# Patient Record
Sex: Female | Born: 1990 | Race: Black or African American | Hispanic: No | Smoking: Former smoker
Health system: Southern US, Community
[De-identification: ages and names within clinical notes are randomized; demographics above are authoritative.]

## PROBLEM LIST (undated history)

## (undated) DIAGNOSIS — B9689 Other specified bacterial agents as the cause of diseases classified elsewhere: Secondary | ICD-10-CM

## (undated) DIAGNOSIS — N760 Acute vaginitis: Secondary | ICD-10-CM

---
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
## 2011-09-05 ENCOUNTER — Other Ambulatory Visit: Payer: Self-pay | Admitting: Family Medicine

## 2011-09-05 DIAGNOSIS — Z3689 Encounter for other specified antenatal screening: Secondary | ICD-10-CM

## 2011-09-10 ENCOUNTER — Ambulatory Visit (HOSPITAL_COMMUNITY)
Admission: RE | Admit: 2011-09-10 | Discharge: 2011-09-10 | Disposition: A | Discharge status: Home / Self Care | Payer: Medicaid Other | Source: Ambulatory Visit | Attending: Family Medicine | Admitting: Family Medicine

## 2011-09-10 DIAGNOSIS — Z1389 Encounter for screening for other disorder: Secondary | ICD-10-CM | POA: Insufficient documentation

## 2011-09-10 DIAGNOSIS — O358XX0 Maternal care for other (suspected) fetal abnormality and damage, not applicable or unspecified: Secondary | ICD-10-CM | POA: Insufficient documentation

## 2011-09-10 DIAGNOSIS — O0930 Supervision of pregnancy with insufficient antenatal care, unspecified trimester: Secondary | ICD-10-CM | POA: Insufficient documentation

## 2011-09-10 DIAGNOSIS — Z363 Encounter for antenatal screening for malformations: Secondary | ICD-10-CM | POA: Insufficient documentation

## 2011-09-10 DIAGNOSIS — Z3689 Encounter for other specified antenatal screening: Secondary | ICD-10-CM

## 2011-10-03 ENCOUNTER — Other Ambulatory Visit: Payer: Self-pay | Admitting: Family Medicine

## 2011-10-03 DIAGNOSIS — O321XX0 Maternal care for breech presentation, not applicable or unspecified: Secondary | ICD-10-CM

## 2011-10-05 ENCOUNTER — Ambulatory Visit (HOSPITAL_COMMUNITY)
Admission: RE | Admit: 2011-10-05 | Discharge: 2011-10-05 | Disposition: A | Discharge status: Home / Self Care | Payer: Medicaid Other | Source: Ambulatory Visit | Attending: Family Medicine | Admitting: Family Medicine

## 2011-10-05 DIAGNOSIS — Z3689 Encounter for other specified antenatal screening: Secondary | ICD-10-CM | POA: Insufficient documentation

## 2011-10-05 DIAGNOSIS — O321XX0 Maternal care for breech presentation, not applicable or unspecified: Secondary | ICD-10-CM | POA: Insufficient documentation

## 2013-10-12 ENCOUNTER — Emergency Department (HOSPITAL_COMMUNITY)
Admission: EM | Admit: 2013-10-12 | Discharge: 2013-10-12 | Disposition: A | Discharge status: Home / Self Care | Payer: Medicaid Other | Attending: Emergency Medicine | Admitting: Emergency Medicine

## 2013-10-12 ENCOUNTER — Encounter (HOSPITAL_COMMUNITY): Payer: Self-pay | Admitting: Emergency Medicine

## 2013-10-12 DIAGNOSIS — Z79899 Other long term (current) drug therapy: Secondary | ICD-10-CM | POA: Insufficient documentation

## 2013-10-12 DIAGNOSIS — L03113 Cellulitis of right upper limb: Secondary | ICD-10-CM

## 2013-10-12 DIAGNOSIS — IMO0002 Reserved for concepts with insufficient information to code with codable children: Secondary | ICD-10-CM | POA: Insufficient documentation

## 2013-10-12 DIAGNOSIS — Z87891 Personal history of nicotine dependence: Secondary | ICD-10-CM | POA: Insufficient documentation

## 2013-10-12 MED ORDER — CEPHALEXIN 500 MG PO CAPS: 500.0000 mg | ORAL_CAPSULE | Freq: Four times a day (QID) | ORAL | Status: DC

## 2013-10-12 NOTE — ED Provider Notes (Signed)
CSN: [id number]     Arrival date & time 10/12/13  1836 History  This chart was scribed for non-physician practitioner Jillyn Ledger, PA-C working with Roney Marion, MD by Leone Payor, ED Scribe. This patient was seen in room TR11C/TR11C and the patient's care was started at 1836.    Chief Complaint  Patient presents with  . Abscess   The history is provided by the patient. No language interpreter was used.    HPI Comments: Donna Perkins is a 22 y.o. female who presents to the Emergency Department complaining of 2 days of gradual onset, gradually worsening, constant redness to the right forearm. Pt states she has associated itching and swelling. Pt states she believes she was bitten by a spider/other insect, but did not see anything bite her. She denies trying anything for these symptoms at home. She denies drainage, fever, severe pain, weakness, loss of sensation, myalgias, nausea, emesis, abdominal pain, arthralgias, or other concerns.  She denies any hx of abscess.     History reviewed. No pertinent past medical history. Past Surgical History  Procedure Laterality Date  . Cesarean section     No family history on file. History  Substance Use Topics  . Smoking status: Former Games developer  . Smokeless tobacco: Not on file  . Alcohol Use: Yes     Comment: occ   OB History   Grav Para Term Preterm Abortions TAB SAB Ect Mult Living                 Review of Systems  Constitutional: Negative for fever, chills, activity change, appetite change and fatigue.  HENT: Negative for rhinorrhea and sore throat.   Respiratory: Negative for cough and shortness of breath.   Cardiovascular: Negative for chest pain and leg swelling.  Gastrointestinal: Negative for nausea, vomiting and abdominal pain.  Genitourinary: Negative for dysuria.  Musculoskeletal: Negative for arthralgias, back pain, gait problem, joint swelling, myalgias, neck pain and neck stiffness.  Skin: Positive for color change.  Negative for rash and wound.       Redness and swelling  Neurological: Negative for weakness, numbness and headaches.  All other systems reviewed and are negative.    Allergies  Review of patient's allergies indicates no known allergies.  Home Medications   Current Outpatient Rx  Name  Route  Sig  Dispense  Refill  . norethindrone (MICRONOR,CAMILA,ERRIN) 0.35 MG tablet   Oral   Take 1 tablet by mouth daily.          BP 114/71  Pulse 76  Temp(Src) 98.7 F (37.1 C) (Oral)  Resp 18  Wt 161 lb 8 oz (73.256 kg)  SpO2 97%  Filed Vitals:   10/12/13 1842 10/12/13 2104  BP: 114/71 116/88  Pulse: 76 71  Temp: 98.7 F (37.1 C)   TempSrc: Oral   Resp: 18 14  Weight: 161 lb 8 oz (73.256 kg)   SpO2: 97% 100%    Physical Exam  Nursing note and vitals reviewed. Constitutional: She is oriented to person, place, and time. She appears well-developed and well-nourished. No distress.  HENT:  Head: Normocephalic and atraumatic.  Nose: Nose normal.  Eyes: Conjunctivae and EOM are normal. Right eye exhibits no discharge. Left eye exhibits no discharge.  Neck: Neck supple.  Cardiovascular: Normal rate, regular rhythm and normal heart sounds.  Exam reveals no gallop and no friction rub.   No murmur heard. Radial pulses present bilaterally  Pulmonary/Chest: Effort normal and breath sounds normal. No  stridor. No respiratory distress. She has no wheezes. She has no rales. She exhibits no tenderness.  Abdominal: Soft. She exhibits no distension. There is no tenderness.  Musculoskeletal: Normal range of motion. She exhibits edema. She exhibits no tenderness.  No tenderness to the right elbow or hand throughout.  Grip strength 5/5 bilaterally.  No limitations with elbow ROM on the right.    Neurological: She is alert and oriented to person, place, and time.  Sensation intact in the right UE  Skin: Skin is warm and dry. She is not diaphoretic. There is erythema.  5 cm x 6 cm circular area  of well circumscribed erythema and mild edema to the right medial middle forearm with no induration or fluctuance.  A pinpoint closed lesion is present in the center with no active drainage or bleeding noted. No ecchymosis.  No other lesions or rash throughout.     Psychiatric: She has a normal mood and affect.    ED Course  Procedures   DIAGNOSTIC STUDIES: Oxygen Saturation is 97% on RA, normal by my interpretation.    COORDINATION OF CARE: 8:56 PM Discussed treatment plan with pt at bedside and pt agreed to plan.   Labs Review Labs Reviewed - No data to display Imaging Review No results found.  EKG Interpretation   None       MDM   1. Cellulitis of right forearm     Donna Perkins is a 21 y.o. female who presents to the Emergency Department complaining of 2 days of gradual onset, gradually worsening, constant redness to the right forearm.  Area marked with a surgical pen.     Patient likely has a developing cellulitis vs abscess from a possible insect bite.  I&D not indicated at this time.  Patient neurovascularly intact, afebrile, and non-toxic in appearance.  Patient prescribed Keflex.  She was instructed to follow-up with a PCP for wound re-check.  She was instructed to return to the ED if she has spreading redness/swelling, fever, drainage, severe pain, or other concerns.  She was instructed to take Benadryl for itching.  Patient in agreement with discharge and plan.     Final impressions: 1. Cellulitis of right forearm     Donna Perkins   I personally performed the services described in this documentation, which was scribed in my presence. The recorded information has been reviewed and is accurate.       Jillyn Ledger, PA-C 10/14/13 1053

## 2013-10-12 NOTE — ED Notes (Signed)
Area of redness marked on pt's skin.

## 2013-10-12 NOTE — ED Notes (Signed)
Pt is here with right forearm red, swollen area that appears to have abscess area in center.  Not draining

## 2013-10-17 NOTE — ED Provider Notes (Signed)
Medical screening examination/treatment/procedure(s) were performed by non-physician practitioner and as supervising physician I was immediately available for consultation/collaboration.  EKG Interpretation   None         Donna Perkins J Aamna Mallozzi, MD 10/17/13 1107 

## 2014-08-13 ENCOUNTER — Emergency Department (HOSPITAL_COMMUNITY)
Admission: EM | Admit: 2014-08-13 | Discharge: 2014-08-13 | Disposition: A | Discharge status: Home / Self Care | Payer: No Typology Code available for payment source | Attending: Emergency Medicine | Admitting: Emergency Medicine

## 2014-08-13 ENCOUNTER — Encounter (HOSPITAL_COMMUNITY): Payer: Self-pay | Admitting: Emergency Medicine

## 2014-08-13 DIAGNOSIS — Z79899 Other long term (current) drug therapy: Secondary | ICD-10-CM | POA: Diagnosis not present

## 2014-08-13 DIAGNOSIS — Z87891 Personal history of nicotine dependence: Secondary | ICD-10-CM | POA: Diagnosis not present

## 2014-08-13 DIAGNOSIS — S0990XA Unspecified injury of head, initial encounter: Secondary | ICD-10-CM | POA: Diagnosis present

## 2014-08-13 DIAGNOSIS — Z792 Long term (current) use of antibiotics: Secondary | ICD-10-CM | POA: Insufficient documentation

## 2014-08-13 DIAGNOSIS — Y9389 Activity, other specified: Secondary | ICD-10-CM | POA: Diagnosis not present

## 2014-08-13 DIAGNOSIS — Y92410 Unspecified street and highway as the place of occurrence of the external cause: Secondary | ICD-10-CM | POA: Insufficient documentation

## 2014-08-13 MED ORDER — ACETAMINOPHEN 325 MG PO TABS
650.0000 mg | ORAL_TABLET | Freq: Once | ORAL | Status: AC
  Administered 2014-08-13: 650 mg via ORAL
  Filled 2014-08-13: qty 2

## 2014-08-13 MED ORDER — ACETAMINOPHEN 325 MG PO TABS: 650.0000 mg | ORAL_TABLET | Freq: Four times a day (QID) | ORAL | Status: DC | PRN
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
## 2014-08-13 NOTE — ED Notes (Signed)
Pt verbalizes understanding of d/c instructions and denies any further needs at this time. 

## 2014-08-13 NOTE — Discharge Instructions (Signed)
Head Injury You have a head injury. Headaches and throwing up (vomiting) are common after a head injury. It should be easy to wake up from sleeping. Sometimes you must stay in the hospital. Most problems happen within the first 24 hours. Side effects may occur up to 7-10 days after the injury.  WHAT ARE THE TYPES OF HEAD INJURIES? Head injuries can be as minor as a bump. Some head injuries can be more severe. More severe head injuries include:  A jarring injury to the brain (concussion).  A bruise of the brain (contusion). This mean there is bleeding in the brain that can cause swelling.  A cracked skull (skull fracture).  Bleeding in the brain that collects, clots, and forms a bump (hematoma). WHEN SHOULD I GET HELP RIGHT AWAY?   You are confused or sleepy.  You cannot be woken up.  You feel sick to your stomach (nauseous) or keep throwing up (vomiting).  Your dizziness or unsteadiness is getting worse.  You have very bad, lasting headaches that are not helped by medicine. Take medicines only as told by your doctor.  You cannot use your arms or legs like normal.  You cannot walk.  You notice changes in the black spots in the center of the colored part of your eye (pupil).  You have clear or bloody fluid coming from your nose or ears.  You have trouble seeing. During the next 24 hours after the injury, you must stay with someone who can watch you. This person should get help right away (call 911 in the [place] if you start to shake and are not able to control it (have seizures), you pass out, or you are unable to wake up. HOW CAN I PREVENT A HEAD INJURY IN THE FUTURE?  Wear seat belts.  Wear a helmet while bike riding and playing sports like football.  Stay away from dangerous activities around the house. WHEN CAN I RETURN TO NORMAL ACTIVITIES AND ATHLETICS? See your doctor before doing these activities. You should not do normal activities or play contact sports until 1 week  after the following symptoms have stopped:  Headache that does not go away.  Dizziness.  Poor attention.  Confusion.  Memory problems.  Sickness to your stomach or throwing up.  Tiredness.  Fussiness.  Bothered by bright lights or loud noises.  Anxiousness or depression.  Restless sleep. MAKE SURE YOU:   Understand these instructions.  Will watch your condition.  Will get help right away if you are not doing well or get worse. Document Released: 11/15/2008 Document Revised: 04/19/2014 Document Reviewed: 08/10/2013 [facility] Patient Information 2015 [place], [place]. This information is not intended to replace advice given to you by your health care provider. Make sure you discuss any questions you have with your health care provider.  Motor Vehicle Collision It is common to have multiple bruises and sore muscles after a motor vehicle collision (MVC). These tend to feel worse for the first 24 hours. You may have the most stiffness and soreness over the first several hours. You may also feel worse when you wake up the first morning after your collision. After this point, you will usually begin to improve with each day. The speed of improvement often depends on the severity of the collision, the number of injuries, and the location and nature of these injuries. HOME CARE INSTRUCTIONS  Put ice on the injured area.  Put ice in a plastic bag.  Place a towel between your skin and  the bag.  Leave the ice on for 15-20 minutes, 3-4 times a day, or as directed by your health care provider.  Drink enough fluids to keep your urine clear or pale yellow. Do not drink alcohol.  Take a warm shower or bath once or twice a day. This will increase blood flow to sore muscles.  You may return to activities as directed by your caregiver. Be careful when lifting, as this may aggravate neck or back pain.  Only take over-the-counter or prescription medicines for pain, discomfort, or fever as  directed by your caregiver. Do not use aspirin. This may increase bruising and bleeding. SEEK IMMEDIATE MEDICAL CARE IF:  You have numbness, tingling, or weakness in the arms or legs.  You develop severe headaches not relieved with medicine.  You have severe neck pain, especially tenderness in the middle of the back of your neck.  You have changes in bowel or bladder control.  There is increasing pain in any area of the body.  You have shortness of breath, light-headedness, dizziness, or fainting.  You have chest pain.  You feel sick to your stomach (nauseous), throw up (vomit), or sweat.  You have increasing abdominal discomfort.  There is blood in your urine, stool, or vomit.  You have pain in your shoulder (shoulder strap areas).  You feel your symptoms are getting worse. MAKE SURE YOU:  Understand these instructions.  Will watch your condition.  Will get help right away if you are not doing well or get worse. Document Released: 12/03/2005 Document Revised: 04/19/2014 Document Reviewed: 05/02/2011 [facility] Patient Information 2015 [place]. This information is not intended to replace advice given to you by your health care provider. Make sure you discuss any questions you have with your health care provider.

## 2014-08-27 ENCOUNTER — Other Ambulatory Visit (HOSPITAL_COMMUNITY): Payer: Self-pay | Admitting: Chiropractic Medicine

## 2014-08-27 ENCOUNTER — Ambulatory Visit (HOSPITAL_COMMUNITY)
Admission: RE | Admit: 2014-08-27 | Discharge: 2014-08-27 | Disposition: A | Discharge status: Home / Self Care | Payer: Managed Care, Other (non HMO) | Source: Ambulatory Visit | Attending: Chiropractic Medicine | Admitting: Chiropractic Medicine

## 2014-08-27 DIAGNOSIS — R52 Pain, unspecified: Secondary | ICD-10-CM

## 2014-08-27 DIAGNOSIS — M549 Dorsalgia, unspecified: Secondary | ICD-10-CM | POA: Insufficient documentation

## 2014-08-27 DIAGNOSIS — IMO0002 Reserved for concepts with insufficient information to code with codable children: Secondary | ICD-10-CM | POA: Insufficient documentation

## 2016-02-03 ENCOUNTER — Emergency Department (HOSPITAL_COMMUNITY)
Admission: EM | Admit: 2016-02-03 | Discharge: 2016-02-03 | Disposition: A | Discharge status: Home / Self Care | Payer: No Typology Code available for payment source | Attending: Emergency Medicine | Admitting: Emergency Medicine

## 2016-02-03 ENCOUNTER — Encounter (HOSPITAL_COMMUNITY): Payer: Self-pay | Admitting: *Deleted

## 2016-02-03 DIAGNOSIS — Z79899 Other long term (current) drug therapy: Secondary | ICD-10-CM | POA: Diagnosis not present

## 2016-02-03 DIAGNOSIS — Z87891 Personal history of nicotine dependence: Secondary | ICD-10-CM | POA: Diagnosis not present

## 2016-02-03 DIAGNOSIS — N390 Urinary tract infection, site not specified: Secondary | ICD-10-CM | POA: Diagnosis not present

## 2016-02-03 DIAGNOSIS — Z3202 Encounter for pregnancy test, result negative: Secondary | ICD-10-CM | POA: Diagnosis not present

## 2016-02-03 DIAGNOSIS — R300 Dysuria: Secondary | ICD-10-CM | POA: Diagnosis present

## 2016-02-03 LAB — URINALYSIS, ROUTINE W REFLEX MICROSCOPIC
GLUCOSE, UA: 100 mg/dL — AB
KETONES UR: NEGATIVE mg/dL
NITRITE: POSITIVE — AB
PH: 6 (ref 5.0–8.0)
Protein, ur: >300 mg/dL — AB

## 2016-02-03 LAB — URINE MICROSCOPIC-ADD ON

## 2016-02-03 LAB — POC URINE PREG, ED: Preg Test, Ur: NEGATIVE

## 2016-02-03 MED ORDER — NITROFURANTOIN MONOHYD MACRO 100 MG PO CAPS: 100.0000 mg | ORAL_CAPSULE | Freq: Two times a day (BID) | ORAL | Status: DC

## 2016-02-03 MED ORDER — PHENAZOPYRIDINE HCL 100 MG PO TABS
200.0000 mg | ORAL_TABLET | Freq: Once | ORAL | Status: AC
  Administered 2016-02-03: 200 mg via ORAL
  Filled 2016-02-03: qty 2

## 2016-02-03 MED ORDER — PHENAZOPYRIDINE HCL 200 MG PO TABS: 200.0000 mg | ORAL_TABLET | Freq: Three times a day (TID) | ORAL | Status: DC | PRN

## 2016-02-03 NOTE — ED Provider Notes (Signed)
CSN: [id number]     Arrival date & time 02/03/16  0536 History   First MD Initiated Contact with Patient 02/03/16 0602     Chief Complaint  Patient presents with  . Dysuria     (Consider location/radiation/quality/duration/timing/severity/associated sxs/prior Treatment) HPI   Patient is a 25 year old female with no past medical history presents the ED with complaint of dysuria, onset 4:30 AM. Patient reports when she woke up this morning to use the bathroom she began having dysuria and noticed a small amount of blood in her urine. Endorses associated urinary frequency. She also reports having mild pelvic discomfort. Denies fever, chills, abdominal pain, N/V/D, vaginal bleeding, vaginal d/c. Denies taking any medications PTA. Pt reports she is currently on birth control and notes she does not have a period. Pt reports abdominal surgical hx of c-section.   History reviewed. No pertinent past medical history. Past Surgical History  Procedure Laterality Date  . Cesarean section     No family history on file. Social History  Substance Use Topics  . Smoking status: Former Games developer  . Smokeless tobacco: Never Used  . Alcohol Use: Yes     Comment: occ   OB History    No data available     Review of Systems  Genitourinary: Positive for dysuria, urgency, frequency, hematuria and pelvic pain.  All other systems reviewed and are negative.     Allergies  Review of patient's allergies indicates no known allergies.  Home Medications   Prior to Admission medications   Medication Sig Start Date End Date Taking? Authorizing Provider  acetaminophen (TYLENOL) 325 MG tablet Take 2 tablets (650 mg total) by mouth every 6 (six) hours as needed for mild pain. 08/13/14  Yes Marcellina Millin, MD  norethindrone (MICRONOR,CAMILA,ERRIN) 0.35 MG tablet Take 1 tablet by mouth daily.   Yes Historical Provider, MD  nitrofurantoin, macrocrystal-monohydrate, (MACROBID) 100 MG capsule Take 1 capsule (100 mg  total) by mouth 2 (two) times daily. 02/03/16   Barrett Henle, PA-C  phenazopyridine (PYRIDIUM) 200 MG tablet Take 1 tablet (200 mg total) by mouth 3 (three) times daily as needed for pain. 02/03/16   Satira Sark Jaiyanna Safran, PA-C   BP 130/69 mmHg  Pulse 106  Temp(Src) 97.9 F (36.6 C) (Oral)  Resp 16  Ht  (1.575 m)  Wt 77.111 kg  BMI 31.09 kg/m2  SpO2 96% Physical Exam  Constitutional: She is oriented to person, place, and time. She appears well-developed and well-nourished. No distress.  HENT:  Head: Normocephalic and atraumatic.  Mouth/Throat: Oropharynx is clear and moist. No oropharyngeal exudate.  Eyes: Conjunctivae and EOM are normal. Right eye exhibits no discharge. Left eye exhibits no discharge. No scleral icterus.  Neck: Normal range of motion. Neck supple.  Cardiovascular: Normal rate, regular rhythm, normal heart sounds and intact distal pulses.   Pulmonary/Chest: Effort normal and breath sounds normal.  Abdominal: Soft. Bowel sounds are normal. She exhibits no distension and no mass. There is no tenderness. There is no rebound and no guarding.  Musculoskeletal: Normal range of motion. She exhibits no edema.  Neurological: She is alert and oriented to person, place, and time.  Skin: Skin is warm and dry. She is not diaphoretic.  Nursing note and vitals reviewed.   ED Course  Procedures (including critical care time) Labs Review Labs Reviewed  URINALYSIS, ROUTINE W REFLEX MICROSCOPIC (NOT AT [facility]) - Abnormal; Notable for the following:    APPearance CLOUDY (*)    Specific Gravity, Urine >  1.030 (*)    Glucose, UA 100 (*)    Hgb urine dipstick LARGE (*)    Bilirubin Urine SMALL (*)    Protein, ur >300 (*)    Nitrite POSITIVE (*)    Leukocytes, UA SMALL (*)    All other components within normal limits  URINE MICROSCOPIC-ADD ON - Abnormal; Notable for the following:    Squamous Epithelial / LPF TOO NUMEROUS TO COUNT (*)    Bacteria, UA MANY (*)    All  other components within normal limits  POC URINE PREG, ED    Imaging Review No results found. I have personally reviewed and evaluated these images and lab results as part of my medical decision-making.  Filed Vitals:   02/03/16 0602  BP: 130/69  Pulse: 106  Temp: 97.9 F (36.6 C)  Resp: 16     MDM   Final diagnoses:  UTI (lower urinary tract infection)    Pt has been diagnosed with a UTI. Pt is afebrile, no CVA tenderness, normotensive, and denies N/V. Pt to be dc home with antibiotics and instructions to follow up with PCP if symptoms persist.   Evaluation does not show pathology requring ongoing emergent intervention or admission. Pt is hemodynamically stable and mentating appropriately. Discussed findings/results and plan with patient/guardian, who agrees with plan. All questions answered. Return precautions discussed and outpatient follow up given.      Satira Sark [place], [place] 02/03/16 1610  Tomasita Crumble, MD 02/03/16 [phone]

## 2016-02-03 NOTE — ED Notes (Signed)
Discharge instructions and prescriptions reviewed - voiced understanding 

## 2016-02-03 NOTE — ED Notes (Signed)
Patient states she was sleeping and was awakened with pain to the lower abd.  Went to the bathroom and noticed she could only void a small amount, painful and then noticed the urine being red

## 2016-02-03 NOTE — Discharge Instructions (Signed)
Take your medications as prescribed. Continue drinking fluids to remain hydrated. Please follow up with a primary care provider from the Resource Guide provided below to establish care. Please return to the Emergency Department if symptoms worsen or new onset of fever, flank pain, abdominal pain, blood in urine.   Emergency Department Resource Guide 1) Find a Doctor and Pay Out of Pocket Although you won't have to find out who is covered by your insurance plan, it is a good idea to ask around and get recommendations. You will then need to call the office and see if the doctor you have chosen will accept you as a new patient and what types of options they offer for patients who are self-pay. Some doctors offer discounts or will set up payment plans for their patients who do not have insurance, but you will need to ask so you aren't surprised when you get to your appointment.  2) Contact Your Local Health Department Not all health departments have doctors that can see patients for sick visits, but many do, so it is worth a call to see if yours does. If you don't know where your local health department is, you can check in your phone book. The CDC also has a tool to help you locate your state's health department, and many state websites also have listings of all of their local health departments.  3) Find a Walk-in Clinic If your illness is not likely to be very severe or complicated, you may want to try a walk in clinic. These are popping up all over the country in pharmacies, drugstores, and shopping centers. They're usually staffed by nurse practitioners or physician assistants that have been trained to treat common illnesses and complaints. They're usually fairly quick and inexpensive. However, if you have serious medical issues or chronic medical problems, these are probably not your best option.  No Primary Care Doctor: - Call Health Connect at  [phone] - they can help you locate a primary care  doctor that  accepts your insurance, provides certain services, etc. - Physician Referral Service- [phone]  Chronic Pain Problems: Organization         Address  Phone   Notes  [facility]  [phone] Patients need to be referred by their primary care doctor.   Medication Assistance: Organization         Address  Phone   Notes  [facility] Medication [facility] [address] [place] [place], [place] [address] [phone] --Must be a resident of [facility] -- Must have NO insurance coverage whatsoever (no Medicaid/ Medicare, etc.) -- The pt. MUST have a primary care doctor that directs their care regularly and follows them in the community   [facility]  [phone]   [employer]  [phone]    Agencies that provide inexpensive medical care: Organization         Address  Phone   Notes  [facility]  [phone]   [facility]    [phone]   [facility] [address] [place], [place] [address] [phone]   [facility] [address] [place], [place] [phone]   Planned Parenthood    [phone]   [facility]    [phone]   [facility]  [address], [place] Phone:  [phone], Fax:  [phone] Hours of Operation:  9 am -  6 pm, M-F.  Also accepts Medicaid/Medicare and self-pay.  [facility] for [place], [address], [place] Phone: [phone], Fax: [phone]. Hours of Operation:  8:30 am - 5:30 pm, M-F.  Also accepts Medicaid and self-pay.  [facility] [place] [address] [place] Phone: [phone]   [place], [place], [place] [phone], Ext. [phone] Mondays & Thursdays: 7-9 AM.  First 15 patients are seen on a first come, first serve basis.    [place]  Providers:  Organization         Address  Phone   Notes  [facility] [address],  [phone] Also accepts self-pay patients.  [facility] [id number] [place], [place]  [phone]   [place], [address], [place] [phone]   [facility] [address], [place] [phone]   Lucianne Lei [address], [place]   [phone] Only accepts [place] Access [place] patients after they have their name applied to their card.   Self-Pay (no insurance) in [facility]:  Organization         Address  Phone   Notes  Sickle Cell Patients, [facility] [place] [phone]   [facility] [phone]   [facility] [place]  [place], [place], [place] [phone]   [facility]/Dr. Osei-Bonsu  [address], [place] or [address], [place] [phone] Phone number for both [place] and [place] locations is the same.  [facility] [address] [place] [phone]   [facility] [address] [place] or [address] [phone]   [facility] [address] [place] [phone], phone; [phone], fax Sees patients 1st and 3rd Saturday of every month.  Must not qualify for public or private insurance (i.e. Medicaid, Medicare, [place] Health Choice, Veterans' Benefits)  Household income should be no more than 200% of the poverty level The clinic cannot treat you if you are pregnant or think you are pregnant  Sexually transmitted diseases are not treated at the clinic.    Dental Care: Organization         Address  Phone  Notes  [facility] Department of [facility] [phone] Accepts children up to age 12 who are enrolled in [place] or [employer]; pregnant women with a Medicaid card; and children who have applied for Medicaid or [place] Health Choice, but were declined, whose parents can pay a reduced fee at time of service.  [facility] Department of [facility]  [address], [place] [phone] Accepts children up to age 3 who are enrolled in [place] or [place]; pregnant women with a Medicaid card; and children who have applied for Medicaid or [place] Health Choice, but were declined, whose parents can pay a reduced fee at time of service.  [facility]  [place] [phone] Patients are seen by appointment only. Walk-ins are not accepted. [place] will see patients 73 years of age and  older. Monday - Tuesday (8am-5pm) Most Wednesdays (8:30-5pm) $30 per visit, cash only  [facility] Adult [employer] PROGRAM  [address], [facility] [phone] Patients are seen by appointment only. Walk-ins are not accepted. [place] will see patients 36 years of age and older. One Wednesday Evening (Monthly: Volunteer Based).  $30 per visit, cash only  [place]  [phone] for adults; Children under age 34, call Graduate Pediatric Dentistry at [phone]. Children aged 22-14, please call [phone] to request a pediatric application.  Dental services are provided in all areas of dental care including fillings, crowns and bridges, complete and partial dentures, implants, gum treatment, root canals, and extractions. Preventive care is also provided. Treatment is provided to both adults and children. Patients are selected via a lottery and there is often a waiting list.   [facility] [address] [place]  [phone] [email or web address]   [facility]  [address] [place], [place] [phone], Ext. [phone] Second and Fourth Thursday of each month, opens at 6:30 AM; Clinic ends at 9 AM.  Patients are seen on a first-come first-served basis, and a limited number are seen during each clinic.   [facility]  [address] Hillard Danker [place], [place] [phone]   Eligibility Requirements You must have lived in [place], [place], or [place] counties for at least the last three months.   You cannot be eligible for state or federal sponsored [employer], including [employer], [place], or [employer].   You generally cannot be eligible for healthcare insurance through your employer.    How to apply: Eligibility screenings are held every Tuesday and Wednesday afternoon from 1:00 pm until 4:00 pm. You do not need an appointment for the interview!  [facility] [address] [place], [place]   [place]  [place] Department  [place]  [phone]    Behavioral Health Resources in the Community: Intensive Outpatient Programs Organization         Address  Phone  Notes  [place]. [address], [place], [place] [phone]   [facility] [address] [place], [place]   [facility]: [facility] [address], [place], [place]   [place] [address],  [place], [place] or [phone]   Substance Abuse Resources Organization         Address  Phone  Notes  [facility] Drug Services  [phone]   [place]  [phone]   [facility] [place]   Chinita Pester  [phone]   Residential & Outpatient Substance Abuse Program  [phone]   Psychological Services Organization         Address  Phone  Notes  [facility]  [place]  [phone]   [place] [address] [place] [phone] or [phone]    Mobile Crisis Teams Organization         Address  Phone  Notes  [facility], Mobile Crisis Care Unit  [phone]   [facility]  [address] [place], [place]   [facility] [address], [place] [phone]    Self-Help/Support Groups Organization         Address  Phone             Notes  [facility] [place] - variety of support groups  [phone]- I7437963 Call for more information  Narcotics Anonymous (NA), [facility] [address], [employer] [place]  2 meetings at this location   Statistician         Address  Phone  Notes  [facility] [address],    [place]  [phone]   [facility]  [address] [place] [address], [place], [place] [phone]   [facility] [address] [place], [place] [phone] Admissions: 8am-3pm M-F  [facility] [address]    [place], [place] [phone]   [facility] [address] [place], [place], [place] [phone]   [facility] [address],  [place], [place] [phone]   [facility] - Intensive Outpatient [address], [place], [place] [phone]   [facility] [address] [place].,  [place], [place] [phone] or [phone]   [facility] ([facility]) [address], [place], [place] [phone] Accepts Medicaid  [facility] [place] [address],  [place] [phone] Substance Abuse/Addiction Treatment   [facility] Organization         Address  Phone  Notes  [facility]  [phone]   Angie Fava, PhD [address] [place]vin [place], [place]   [phone] or [phone]    [facility] Behavioral   [address] [place], [place] [phone]   [facility] [address] [place], [place] [phone] Insurance/Medicaid/sponsorship through [facility] and Families [address]                                    [place], [place] [phone] Therapy/tele-psych/case  [facility] [address], [place] [phone]    Dr. Lolly Mustache  [phone]   [facility]  [employer] [facility] Dept. 1) [address], [place] 2) [address], [place] 3)  [address], [place] [phone]  [phone]   [facility] Child Abuse Hotline 734-120-8247 or 9793626622 (After Hours)

## 2016-08-11 ENCOUNTER — Emergency Department (HOSPITAL_COMMUNITY)
Admission: EM | Admit: 2016-08-11 | Discharge: 2016-08-11 | Disposition: A | Discharge status: Home / Self Care | Payer: Managed Care, Other (non HMO) | Attending: Emergency Medicine | Admitting: Emergency Medicine

## 2016-08-11 ENCOUNTER — Encounter (HOSPITAL_COMMUNITY): Payer: Self-pay | Admitting: Emergency Medicine

## 2016-08-11 DIAGNOSIS — H6091 Unspecified otitis externa, right ear: Secondary | ICD-10-CM | POA: Insufficient documentation

## 2016-08-11 DIAGNOSIS — H65111 Acute and subacute allergic otitis media (mucoid) (sanguinous) (serous), right ear: Secondary | ICD-10-CM

## 2016-08-11 DIAGNOSIS — H65191 Other acute nonsuppurative otitis media, right ear: Secondary | ICD-10-CM | POA: Diagnosis not present

## 2016-08-11 DIAGNOSIS — H9201 Otalgia, right ear: Secondary | ICD-10-CM | POA: Diagnosis present

## 2016-08-11 DIAGNOSIS — Z87891 Personal history of nicotine dependence: Secondary | ICD-10-CM | POA: Insufficient documentation

## 2016-08-11 MED ORDER — AMOXICILLIN 500 MG PO CAPS: 500.0000 mg | ORAL_CAPSULE | Freq: Three times a day (TID) | ORAL | 0 refills | Status: DC

## 2016-08-11 MED ORDER — NEOMYCIN-POLYMYXIN-HC 3.5-10000-1 OT SUSP: 4.0000 [drp] | Freq: Four times a day (QID) | OTIC | 0 refills | Status: DC

## 2016-08-11 NOTE — ED Provider Notes (Signed)
[facility]EM[facility] DEPT Provider Note   CSN: [id number] Arrival date & time: 08/11/16  1149     History   Chief Complaint Chief Complaint  Patient presents with  . Otalgia    HPI Donna Perkins is a 25 y.o. female.  The history is provided by the patient. No language interpreter was used.  Otalgia  This is a new problem. The current episode started yesterday. There is pain in the right ear. The problem has been gradually worsening. There has been no fever. The pain is at a severity of 4/10. The pain is moderate. Pertinent negatives include no ear discharge and no headaches. Her past medical history does not include hearing loss.  Pt reports ear itches.  Pt reports she scratched with her nail,  Now pain  History reviewed. No pertinent past medical history.  There are no active problems to display for this patient.   Past Surgical History:  Procedure Laterality Date  . CESAREAN SECTION      OB History    No data available       Home Medications    Prior to Admission medications   Medication Sig Start Date End Date Taking? Authorizing Provider  acetaminophen (TYLENOL) 325 MG tablet Take 2 tablets (650 mg total) by mouth every 6 (six) hours as needed for mild pain. 08/13/14   Marcellina Millin, MD  amoxicillin (AMOXIL) 500 MG capsule Take 1 capsule (500 mg total) by mouth 3 (three) times daily. 08/11/16   Elson Areas, PA-C  neomycin-polymyxin-hydrocortisone (CORTISPORIN) 3.5-10000-1 otic suspension Place 4 drops into the right ear 4 (four) times daily. 08/11/16   Elson Areas, PA-C  nitrofurantoin, macrocrystal-monohydrate, (MACROBID) 100 MG capsule Take 1 capsule (100 mg total) by mouth 2 (two) times daily. 02/03/16   Barrett Henle, PA-C  norethindrone (MICRONOR,CAMILA,ERRIN) 0.35 MG tablet Take 1 tablet by mouth daily.    Historical Provider, MD  phenazopyridine (PYRIDIUM) 200 MG tablet Take 1 tablet (200 mg total) by mouth 3 (three) times daily as needed for pain.  02/03/16   Barrett Henle, PA-C    Family History History reviewed. No pertinent family history.  Social History Social History  Substance Use Topics  . Smoking status: Former Games developer  . Smokeless tobacco: Never Used  . Alcohol use Yes     Comment: occ     Allergies   Review of patient's allergies indicates no known allergies.   Review of Systems Review of Systems  HENT: Positive for ear pain. Negative for ear discharge.   Neurological: Negative for headaches.  All other systems reviewed and are negative.    Physical Exam Updated Vital Signs BP 117/85 (BP Location: Right Arm)   Pulse 93   Temp 98.3 F (36.8 C) (Oral)   Resp 20   LMP 05/29/2016   SpO2 100%   Physical Exam  Constitutional: She appears well-developed and well-nourished. No distress.  HENT:  Head: Normocephalic and atraumatic.  Right canal erythematous, tm erythematous,    Eyes: Conjunctivae are normal.  Neck: Neck supple.  Cardiovascular: Normal rate and regular rhythm.   No murmur heard. Pulmonary/Chest: Effort normal and breath sounds normal. No respiratory distress.  Abdominal: Soft. There is no tenderness.  Musculoskeletal: She exhibits no edema.  Neurological: She is alert.  Skin: Skin is warm and dry.  Psychiatric: She has a normal mood and affect.  Nursing note and vitals reviewed.    ED Treatments / Results  Labs (all labs ordered are listed, but only  abnormal results are displayed) Labs Reviewed - No data to display  EKG  EKG Interpretation None       Radiology No results found.  Procedures Procedures (including critical care time)  Medications Ordered in ED Medications - No data to display   Initial Impression / Assessment and Plan / ED Course  I have reviewed the triage vital signs and the nursing notes.  Pertinent labs & imaging results that were available during my care of the patient were reviewed by me and considered in my medical decision making  (see chart for details).   Meds ordered this encounter  Medications  . amoxicillin (AMOXIL) 500 MG capsule    Sig: Take 1 capsule (500 mg total) by mouth 3 (three) times daily.    Dispense:  21 capsule    Refill:  0    Order Specific Question:   Supervising Provider    Answer:   MILLER, BRIAN [[id number]]  . neomycin-polymyxin-hydrocortisone (CORTISPORIN) 3.5-10000-1 otic suspension    Sig: Place 4 drops into the right ear 4 (four) times daily.    Dispense:  10 mL    Refill:  0    Order Specific Question:   Supervising Provider    Answer:   Eber HongMILLER, BRIAN [[id number]]    Final Clinical Impressions(s) / ED Diagnoses   Final diagnoses:  Otitis externa, right  Acute mucoid otitis media of right ear    New Prescriptions New Prescriptions   AMOXICILLIN (AMOXIL) 500 MG CAPSULE    Take 1 capsule (500 mg total) by mouth 3 (three) times daily.   NEOMYCIN-POLYMYXIN-HYDROCORTISONE (CORTISPORIN) 3.5-10000-1 OTIC SUSPENSION    Place 4 drops into the right ear 4 (four) times daily.  An After Visit Summary was printed and given to the patient.   Lonia SkinnerLeslie K RollaSofia, PA-C 08/11/16 1339    [address] LaneLeslie K Lake TelemarkSofia, [place]PA-C 08/11/16 1357    Margarita Grizzleanielle Ray, MD 08/12/16 1028

## 2016-08-11 NOTE — ED Notes (Signed)
Discharge instructions and follow up care reviewed with patient.  She verbalizes understanding.

## 2016-08-11 NOTE — ED Triage Notes (Signed)
Pt here with otalgia since yesterday. No fevers. Worse in right ear. 1000 mg tylenol 1100.

## 2017-05-31 ENCOUNTER — Encounter (HOSPITAL_COMMUNITY): Payer: Self-pay

## 2017-05-31 ENCOUNTER — Emergency Department (HOSPITAL_COMMUNITY): Payer: Managed Care, Other (non HMO)

## 2017-05-31 ENCOUNTER — Emergency Department (HOSPITAL_COMMUNITY)
Admission: EM | Admit: 2017-05-31 | Discharge: 2017-05-31 | Disposition: A | Discharge status: Home / Self Care | Payer: Managed Care, Other (non HMO) | Attending: Emergency Medicine | Admitting: Emergency Medicine

## 2017-05-31 DIAGNOSIS — J029 Acute pharyngitis, unspecified: Secondary | ICD-10-CM | POA: Insufficient documentation

## 2017-05-31 DIAGNOSIS — J069 Acute upper respiratory infection, unspecified: Secondary | ICD-10-CM | POA: Diagnosis not present

## 2017-05-31 DIAGNOSIS — R519 Headache, unspecified: Secondary | ICD-10-CM

## 2017-05-31 DIAGNOSIS — R51 Headache: Secondary | ICD-10-CM | POA: Diagnosis not present

## 2017-05-31 DIAGNOSIS — R05 Cough: Secondary | ICD-10-CM | POA: Diagnosis present

## 2017-05-31 DIAGNOSIS — Z87891 Personal history of nicotine dependence: Secondary | ICD-10-CM | POA: Diagnosis not present

## 2017-05-31 DIAGNOSIS — B9789 Other viral agents as the cause of diseases classified elsewhere: Secondary | ICD-10-CM

## 2017-05-31 DIAGNOSIS — R112 Nausea with vomiting, unspecified: Secondary | ICD-10-CM | POA: Insufficient documentation

## 2017-05-31 LAB — POC URINE PREG, ED: PREG TEST UR: NEGATIVE

## 2017-05-31 LAB — RAPID STREP SCREEN (MED CTR MEBANE ONLY): STREPTOCOCCUS, GROUP A SCREEN (DIRECT): NEGATIVE

## 2017-05-31 MED ORDER — IBUPROFEN 400 MG PO TABS
400.0000 mg | ORAL_TABLET | Freq: Four times a day (QID) | ORAL | 0 refills | Status: DC | PRN
Start: 2017-05-31 — End: 2020-08-11

## 2017-05-31 MED ORDER — ONDANSETRON 4 MG PO TBDP: 4.0000 mg | ORAL_TABLET | Freq: Three times a day (TID) | ORAL | 0 refills | Status: DC | PRN

## 2017-05-31 MED ORDER — SODIUM CHLORIDE 0.9 % IV BOLUS (SEPSIS)
1000.0000 mL | Freq: Once | INTRAVENOUS | Status: AC
  Administered 2017-05-31: 1000 mL via INTRAVENOUS

## 2017-05-31 MED ORDER — BENZONATATE 100 MG PO CAPS: 100.0000 mg | ORAL_CAPSULE | Freq: Three times a day (TID) | ORAL | 0 refills | Status: DC | PRN

## 2017-05-31 MED ORDER — ACETAMINOPHEN 500 MG PO TABS: 500.0000 mg | ORAL_TABLET | Freq: Four times a day (QID) | ORAL | 0 refills | Status: DC | PRN

## 2017-05-31 MED ORDER — ONDANSETRON HCL 4 MG/2ML IJ SOLN
4.0000 mg | Freq: Once | INTRAMUSCULAR | Status: AC
  Administered 2017-05-31: 4 mg via INTRAVENOUS
  Filled 2017-05-31: qty 2

## 2017-05-31 NOTE — ED Provider Notes (Signed)
[facility] Provider Note   CSN: [id number] Arrival date & time: 05/31/17  0021 By signing my name below, I, Levon Hedger, attest that this documentation has been prepared under the direction and in the presence of Alvira Monday, MD . Electronically Signed: Levon Hedger, Scribe. 05/31/2017. 3:43 AM.   History   Chief Complaint Chief Complaint  Patient presents with  . Cough  . Sore Throat  . Emesis    HPI Donna Perkins is a 26 y.o. female who presents to the Emergency Department complaining of gradual onset, gradually worsening headache onset two days ago. Pt describes her headache as throbbing and tightness. She reports associated productive cough x 1 week, sore throat, mild SOB, nausea, vomiting 1x,  and chills. Pt states her headache is exacerbated by coughing, vomiting and with bowel movements. No recent sick contact. She has taken Aleve tonight with significant relief of symptoms. Pt denies any rhinorrhea, abdominal pain, dysuria, fever, otalgia, or chest pain.  The history is provided by the patient. No language interpreter was used.   History reviewed. No pertinent past medical history.  There are no active problems to display for this patient.   Past Surgical History:  Procedure Laterality Date  . CESAREAN SECTION      OB History    No data available       Home Medications    Prior to Admission medications   Medication Sig Start Date End Date Taking? Authorizing Provider  acetaminophen (TYLENOL) 500 MG tablet Take 1 tablet (500 mg total) by mouth every 6 (six) hours as needed. 05/31/17   Alvira Monday, MD  amoxicillin (AMOXIL) 500 MG capsule Take 1 capsule (500 mg total) by mouth 3 (three) times daily. Patient not taking: Reported on 05/31/2017 08/11/16   Elson Areas, PA-C  benzonatate (TESSALON) 100 MG capsule Take 1 capsule (100 mg total) by mouth 3 (three) times daily as needed for cough. 05/31/17   Alvira Monday, MD  ibuprofen  (ADVIL,MOTRIN) 400 MG tablet Take 1 tablet (400 mg total) by mouth every 6 (six) hours as needed. 05/31/17   Alvira Monday, MD  neomycin-polymyxin-hydrocortisone (CORTISPORIN) 3.5-10000-1 otic suspension Place 4 drops into the right ear 4 (four) times daily. Patient not taking: Reported on 05/31/2017 08/11/16   Elson Areas, PA-C  nitrofurantoin, macrocrystal-monohydrate, (MACROBID) 100 MG capsule Take 1 capsule (100 mg total) by mouth 2 (two) times daily. Patient not taking: Reported on 05/31/2017 02/03/16   Barrett Henle, PA-C  ondansetron [facility] ODT) 4 MG disintegrating tablet Take 1 tablet (4 mg total) by mouth every 8 (eight) hours as needed for nausea or vomiting. 05/31/17   Alvira Monday, MD  phenazopyridine (PYRIDIUM) 200 MG tablet Take 1 tablet (200 mg total) by mouth 3 (three) times daily as needed for pain. Patient not taking: Reported on 05/31/2017 02/03/16   Barrett Henle, PA-C    Family History History reviewed. No pertinent family history.  Social History Social History  Substance Use Topics  . Smoking status: Former Games developer  . Smokeless tobacco: Never Used  . Alcohol use Yes     Comment: occ     Allergies   Patient has no known allergies.   Review of Systems Review of Systems  Constitutional: Negative for fever.  HENT: Positive for sore throat. Negative for ear pain and rhinorrhea.   Eyes: Negative for visual disturbance.  Respiratory: Positive for shortness of breath. Negative for cough.   Cardiovascular: Negative for chest pain.  Gastrointestinal: Positive for  nausea and vomiting. Negative for abdominal pain.  Genitourinary: Negative for difficulty urinating and dysuria.  Musculoskeletal: Negative for back pain and neck pain.  Skin: Negative for rash.  Neurological: Positive for headaches. Negative for syncope.   Physical Exam Updated Vital Signs BP (!) 93/57 (BP Location: Right Arm)   Pulse 83   Temp 98.6 F (37 C) (Oral)   Resp  17   Ht 5\' 3"  (1.6 m)   Wt 85.7 kg (189 lb)   LMP 03/05/2017 (Approximate) Comment: neg preg test  SpO2 99%   BMI 33.48 kg/m   Physical Exam  Constitutional: She is oriented to person, place, and time. She appears well-developed and well-nourished. No distress.  HENT:  Head: Normocephalic and atraumatic.  Eyes: Conjunctivae and EOM are normal.  Neck: Normal range of motion.  Cardiovascular: Normal rate, regular rhythm, normal heart sounds and intact distal pulses.  Exam reveals no gallop and no friction rub.   No murmur heard. Pulmonary/Chest: Effort normal and breath sounds normal. No respiratory distress. She has no wheezes. She has no rales.  Abdominal: Soft. She exhibits no distension. There is no tenderness. There is no guarding.  Musculoskeletal: Normal range of motion. She exhibits no edema or tenderness.  Neurological: She is alert and oriented to person, place, and time.  Skin: Skin is warm and dry. No rash noted. She is not diaphoretic. No erythema.  Psychiatric: She has a normal mood and affect. Judgment normal.  Nursing note and vitals reviewed.   ED Treatments / Results  DIAGNOSTIC STUDIES:  Oxygen Saturation is 99% on RA, normal by my interpretation.    COORDINATION OF CARE:  3:37 AM Discussed treatment plan with pt at bedside and pt agreed to plan.   Labs (all labs ordered are  listed, but only abnormal results are displayed) Labs Reviewed  RAPID STREP SCREEN (NOT AT [facility])  CULTURE, GROUP A STREP ([facility])  POC URINE PREG, ED    EKG  EKG Interpretation None       Radiology No results found.  Procedures Procedures (including critical care time)  Medications Ordered in ED Medications  sodium chloride 0.9 % bolus 1,000 mL (0 mLs Intravenous Stopped 05/31/17 0445)  ondansetron (ZOFRAN) injection 4 mg (4 mg Intravenous Given 05/31/17 0229)     Initial Impression / Assessment and Plan / ED Course  I have reviewed the triage vital signs and the  nursing notes.  Pertinent labs & imaging results that were available during my care of the patient were reviewed by me and considered in my medical decision making (see chart for details).     26yo female presents with concern for sore throat, emesis, cough, headache.  Strep screen negative. No sign of PTA/RPA/epiglottitis. XR without signs of pneumonia.  Suspect viral etiology of constellation of symptoms. Given tessalon, zofran. Patient discharged in stable condition with understanding of reasons to return.   Final Clinical Impressions(s) / ED Diagnoses   Final diagnoses:  Viral URI with cough  Nausea and vomiting, intractability of vomiting not specified, unspecified vomiting type  Nonintractable headache, unspecified chronicity pattern, unspecified headache type  Sore throat    New Prescriptions Discharge Medication List as of 05/31/2017  4:55 AM    START taking these medications   Details  benzonatate (TESSALON) 100 MG capsule Take 1 capsule (100 mg total) by mouth 3 (three) times daily as needed for cough., Starting Fri 05/31/2017, Print    ibuprofen (ADVIL,MOTRIN) 400 MG tablet Take 1 tablet (400 mg  total) by mouth every 6 (six) hours as needed., Starting Fri 05/31/2017, Print    ondansetron (ZOFRAN ODT) 4 MG disintegrating tablet Take 1 tablet (4 mg total) by mouth every 8 (eight) hours as needed for nausea or vomiting., Starting Fri 05/31/2017, Print       I personally performed the services described in this documentation, which was scribed in my presence. The recorded information has been reviewed and is accurate.    Alvira MondaySchlossman, Ashten Prats, MD 06/02/17 [id number]

## 2017-05-31 NOTE — ED Triage Notes (Signed)
Pt endorses sore throat x 3 days, headache x 2 days, and 1 episode of vomiting this evening.

## 2017-05-31 NOTE — ED Notes (Signed)
Pt requesting work note for today upon discharge.

## 2017-05-31 NOTE — ED Notes (Signed)
Pt departed in NAD, refused use of wheelchair.  

## 2017-05-31 NOTE — ED Notes (Signed)
Patient transported to X-ray 

## 2017-06-02 LAB — CULTURE, GROUP A STREP (THRC)

## 2018-04-11 IMAGING — CR DG CHEST 2V
2 series · 2 of 2 positions shown · non-contrast
Comparison: Thoracic spine radiographs performed 08/27/2014

CLINICAL DATA: Acute onset of shortness of breath, headache, cough
and sore throat. Initial encounter.

EXAM:
CHEST  2 VIEW

[chest pa]
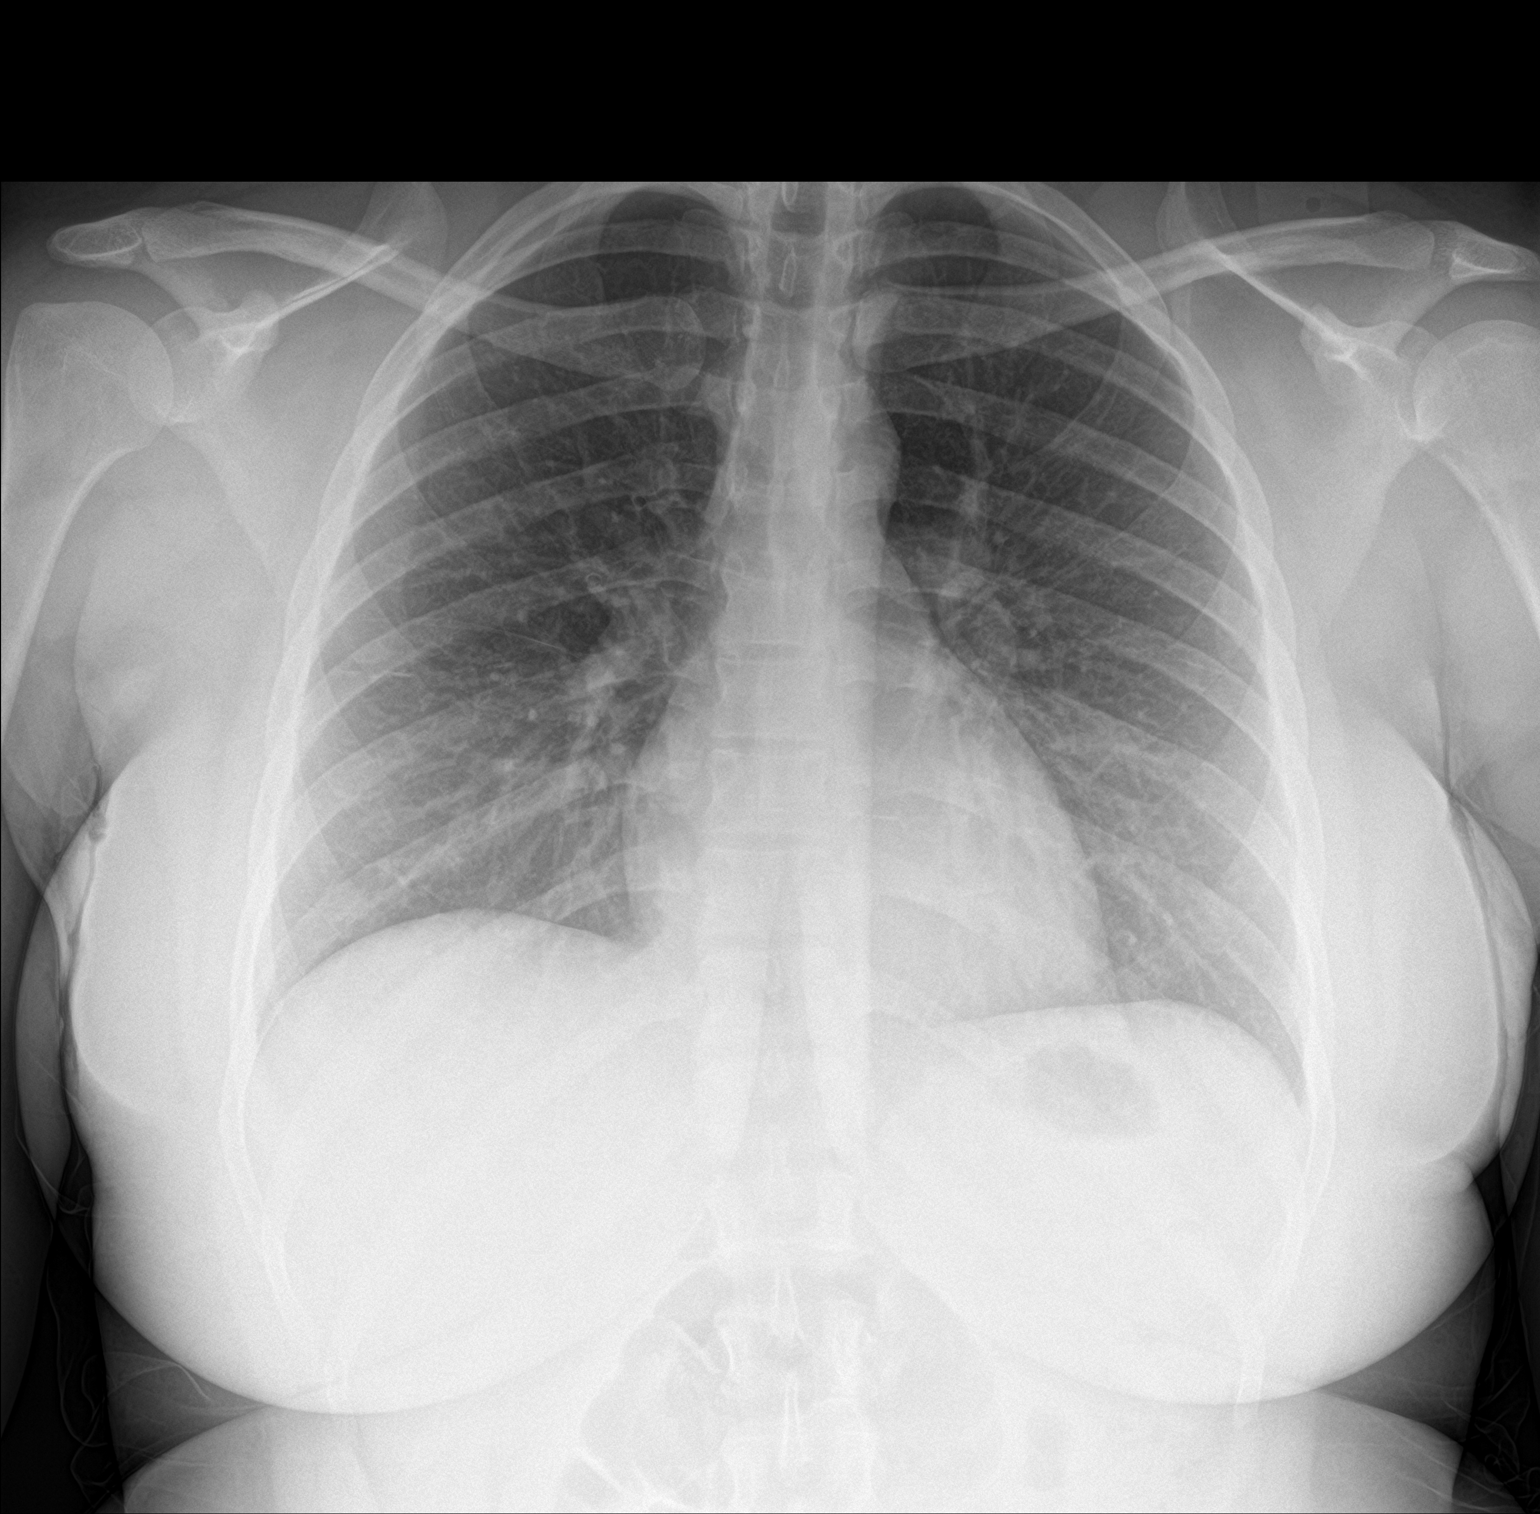

[chest lat]
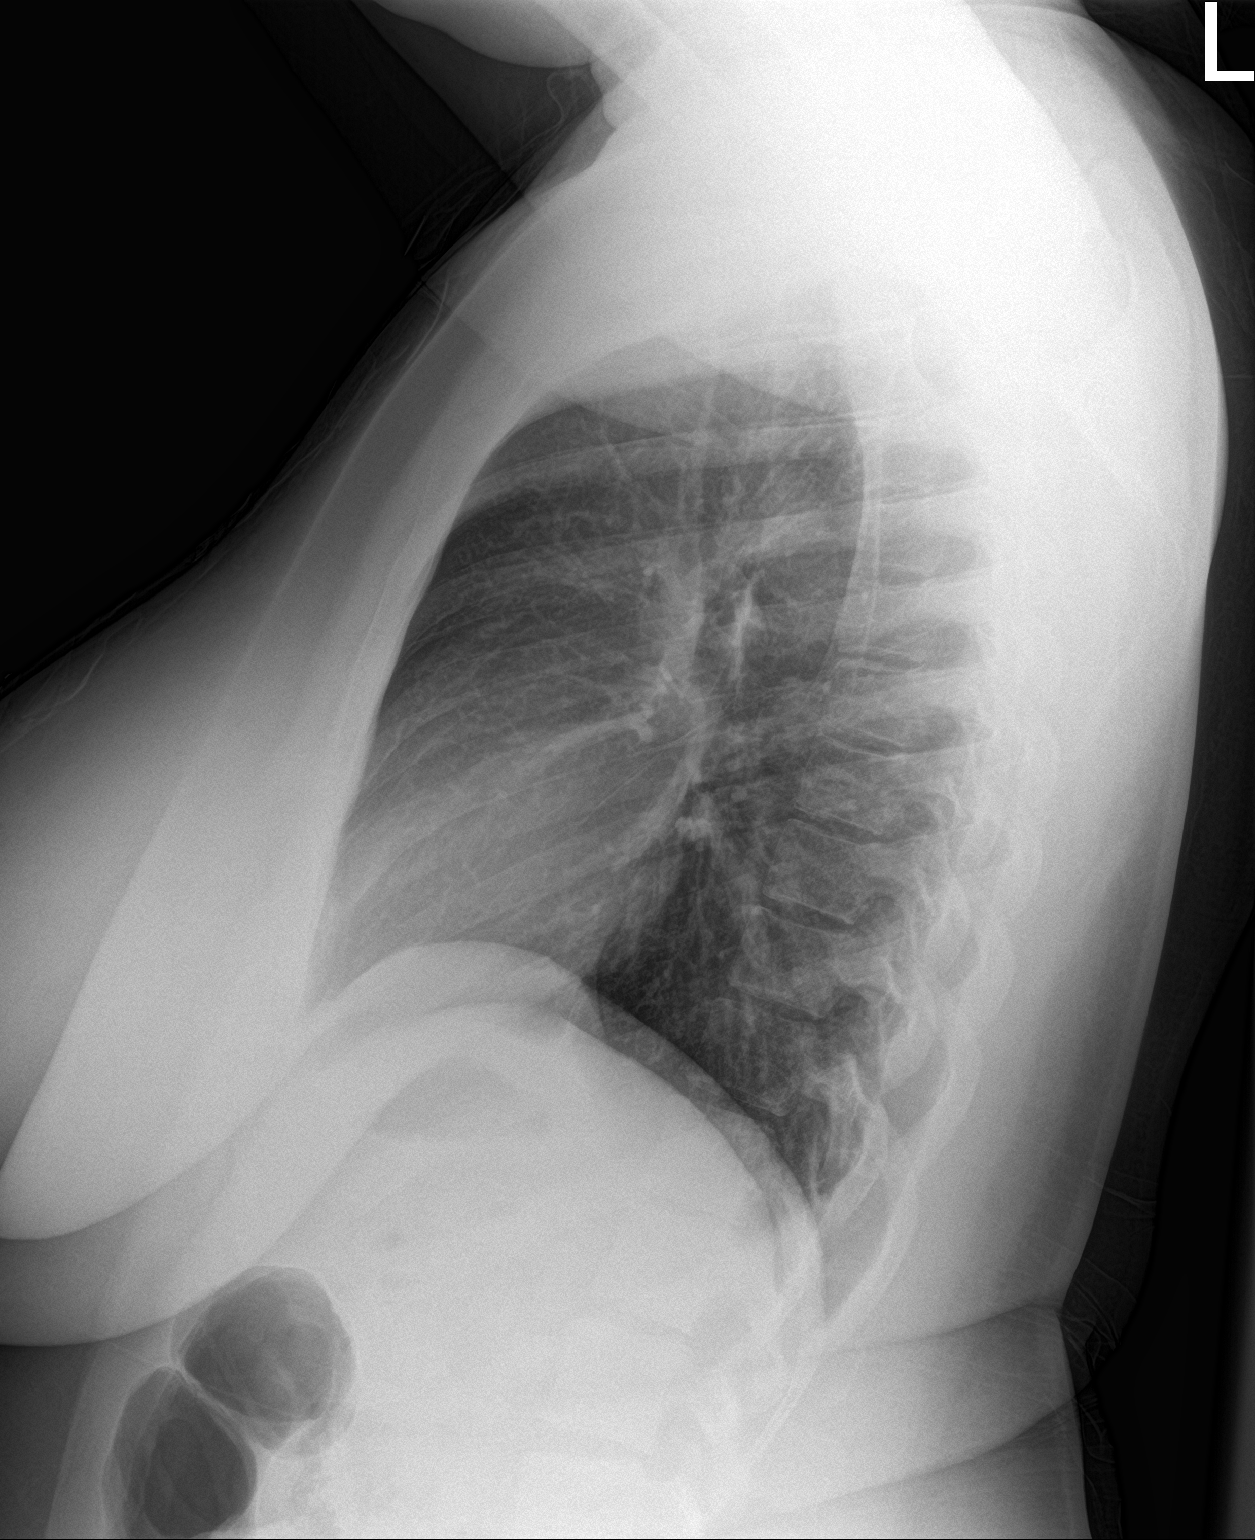

[2 of 2 positions shown; findings below may reference images not displayed]

FINDINGS: The lungs are well-aerated and clear. There is no evidence of focal
opacification, pleural effusion or pneumothorax.

The heart is normal in size; the mediastinal contour is within
normal limits. No acute osseous abnormalities are seen.
IMPRESSION: No acute cardiopulmonary process seen.

## 2020-05-10 ENCOUNTER — Ambulatory Visit (HOSPITAL_COMMUNITY)
Admission: EM | Admit: 2020-05-10 | Discharge: 2020-05-10 | Disposition: A | Discharge status: Home / Self Care | Payer: 59 | Attending: Emergency Medicine | Admitting: Emergency Medicine

## 2020-05-10 ENCOUNTER — Other Ambulatory Visit: Payer: Self-pay

## 2020-05-10 ENCOUNTER — Encounter (HOSPITAL_COMMUNITY): Payer: Self-pay

## 2020-05-10 DIAGNOSIS — N760 Acute vaginitis: Secondary | ICD-10-CM | POA: Diagnosis present

## 2020-05-10 MED ORDER — FLUCONAZOLE 150 MG PO TABS: 150.0000 mg | ORAL_TABLET | Freq: Once | ORAL | 0 refills | Status: AC

## 2020-05-10 MED ORDER — METRONIDAZOLE 500 MG PO TABS: 500.0000 mg | ORAL_TABLET | Freq: Two times a day (BID) | ORAL | 0 refills | Status: AC

## 2020-05-10 NOTE — ED Provider Notes (Signed)
[facility]    CSN: [id number] Arrival date & time: 05/10/20  0857      History   Chief Complaint Chief Complaint  Patient presents with  . Vaginitis    HPI Donna Perkins is a 29 y.o. female no significant past medical history presenting today for evaluation of vaginal discharge and itching.  Patient reports over the past few days she has had thicker discharge and increase in amount from her normal discharge.  She has also had associated itching.  She denies any urinary symptoms of dysuria, increased frequency or urgency.  She reports history of BV, but this feels slightly different.  Typically will have an odor with BV.  She reports being sexually active with only females and denies any possibility of pregnancy.  Last menstrual cycle was 5/5.  Is not on any form of birth control.  HPI  History reviewed. No pertinent past medical history.  There are no problems to display for this patient.   Past Surgical History:  Procedure Laterality Date  . CESAREAN SECTION      OB History   No obstetric history on file.      Home Medications    Prior to Admission medications   Medication Sig Start Date End Date Taking? Authorizing Provider  acetaminophen (TYLENOL) 500 MG tablet Take 1 tablet (500 mg total) by mouth every 6 (six) hours as needed. 05/31/17   Alvira Monday, MD  benzonatate (TESSALON) 100 MG capsule Take 1 capsule (100 mg total) by mouth 3 (three) times daily as needed for cough. 05/31/17   Alvira Monday, MD  fluconazole (DIFLUCAN) 150 MG tablet Take 1 tablet (150 mg total) by mouth once for 1 dose. 05/10/20 05/10/20  Arelly Whittenberg C, PA-C  ibuprofen (ADVIL,MOTRIN) 400 MG tablet Take 1 tablet (400 mg total) by mouth every 6 (six) hours as needed. 05/31/17   Alvira Monday, MD  metroNIDAZOLE (FLAGYL) 500 MG tablet Take 1 tablet (500 mg total) by mouth 2 (two) times daily for 7 days. 05/10/20 05/17/20  Medha Pippen C, PA-C  ondansetron (ZOFRAN ODT) 4 MG  disintegrating tablet Take 1 tablet (4 mg total) by mouth every 8 (eight) hours as needed for nausea or vomiting. 05/31/17   Alvira Monday, MD    Family History Family History  Family history unknown: Yes    Social History Social History   Tobacco Use  . Smoking status: Former Games developer  . Smokeless tobacco: Never Used  Substance Use Topics  . Alcohol use: Yes    Comment: occ  . Drug use: Yes    Types: Marijuana    Comment: occ     Allergies   Patient has no known allergies.   Review of Systems Review of Systems  Constitutional: Negative for fever.  Respiratory: Negative for shortness of breath.   Cardiovascular: Negative for chest pain.  Gastrointestinal: Negative for abdominal pain, diarrhea, nausea and vomiting.  Genitourinary: Positive for vaginal discharge. Negative for dysuria, flank pain, genital sores, hematuria, menstrual problem, vaginal bleeding and vaginal pain.  Musculoskeletal: Negative for back pain.  Skin: Negative for rash.  Neurological: Negative for dizziness, light-headedness and headaches.     Physical Exam Triage Vital Signs ED Triage Vitals  Enc Vitals Group     BP 05/10/20 0942 109/74     Pulse Rate 05/10/20 0942 77     Resp 05/10/20 0942 18     Temp 05/10/20 0942 98.2 F (36.8 C)     Temp Source 05/10/20 0942 Oral  SpO2 05/10/20 0942 100 %     Weight --      Height --      Head Circumference --      Peak Flow --      Pain Score 05/10/20 0938 1     Pain Loc --      Pain Edu? --      Excl. in [place]? --    No data found.  Updated Vital Signs BP 109/74 (BP Location: Left Arm)   Pulse 77   Temp 98.2 F (36.8 C) (Oral)   Resp 18   LMP 04/20/2020   SpO2 100%   Visual Acuity Right Eye Distance:   Left Eye Distance:   Bilateral Distance:    Right Eye Near:   Left Eye Near:    Bilateral Near:     Physical Exam Vitals and nursing note reviewed.  Constitutional:      Appearance: She is well-developed.     Comments: No  acute distress  HENT:     Head: Normocephalic and atraumatic.     Nose: Nose normal.  Eyes:     Conjunctiva/sclera: Conjunctivae normal.  Cardiovascular:     Rate and Rhythm: Normal rate.  Pulmonary:     Effort: Pulmonary effort is normal. No respiratory distress.  Abdominal:     General: There is no distension.     Comments: Soft, nondistended, nontender to light and deep palpation throughout abdomen  Musculoskeletal:        General: Normal range of motion.     Cervical back: Neck supple.  Skin:    General: Skin is warm and dry.  Neurological:     Mental Status: She is alert and oriented to person, place, and time.      UC Treatments / Results  Labs (all labs ordered are listed, but only abnormal results are displayed) Labs Reviewed  CERVICOVAGINAL ANCILLARY ONLY    EKG   Radiology No results found.  Procedures Procedures (including critical care time)  Medications Ordered in UC Medications - No data to display  Initial Impression / Assessment and Plan / UC Course  I have reviewed the triage vital signs and the nursing notes.  Pertinent labs & imaging results that were available during my care of the patient were reviewed by me and considered in my medical decision making (see chart for details).     Empirically treating for yeast and BV today with Diflucan and Flagyl.  Vaginal swab pending for STD screening as well as further evaluation of discharge. Discussed strict return precautions. Patient verbalized understanding and is agreeable with plan.   Final Clinical Impressions(s) / UC Diagnoses   Final diagnoses:  Vaginitis and vulvovaginitis     Discharge Instructions     Take 1 tab of Diflucan today to treat for yeast.  May repeat in 3 to 4 days if still having symptoms. Metronidazole/Flagyl twice daily for the next week to cover bacterial vaginosis.  No alcohol while taking.  We are testing you for Gonorrhea, Chlamydia, Trichomonas, Yeast and  Bacterial Vaginosis. We will call you if anything is positive and let you know if you require any further treatment. Please inform partners of any positive results.   Please return if symptoms not improving with treatment, development of fever, nausea, vomiting, abdominal pain.    ED Prescriptions    Medication Sig Dispense Auth. Provider   fluconazole (DIFLUCAN) 150 MG tablet Take 1 tablet (150 mg total) by mouth once for 1 dose.  2 tablet Ailynn Gow C, PA-C   metroNIDAZOLE (FLAGYL) 500 MG tablet Take 1 tablet (500 mg total) by mouth 2 (two) times daily for 7 days. 14 tablet Manika Hast, [place] C, PA-C     PDMP not reviewed this encounter.   Janith Lima, PA-C 05/10/20 1034

## 2020-05-10 NOTE — Discharge Instructions (Signed)
Take 1 tab of Diflucan today to treat for yeast.  May repeat in 3 to 4 days if still having symptoms. Metronidazole/Flagyl twice daily for the next week to cover bacterial vaginosis.  No alcohol while taking.  We are testing you for Gonorrhea, Chlamydia, Trichomonas, Yeast and Bacterial Vaginosis. We will call you if anything is positive and let you know if you require any further treatment. Please inform partners of any positive results.   Please return if symptoms not improving with treatment, development of fever, nausea, vomiting, abdominal pain.

## 2020-05-10 NOTE — ED Triage Notes (Signed)
Pt presents with abnormal vaginal discharge and itchiness for past few days.

## 2020-05-11 LAB — CERVICOVAGINAL ANCILLARY ONLY
Bacterial Vaginitis (gardnerella): POSITIVE — AB
Candida Glabrata: NEGATIVE
Candida Vaginitis: NEGATIVE
Chlamydia: NEGATIVE
Comment: NEGATIVE
Comment: NEGATIVE
Comment: NEGATIVE
Comment: NEGATIVE
Comment: NEGATIVE
Comment: NORMAL
Neisseria Gonorrhea: NEGATIVE
Trichomonas: NEGATIVE

## 2020-05-17 ENCOUNTER — Other Ambulatory Visit: Payer: Self-pay

## 2020-05-17 ENCOUNTER — Ambulatory Visit (HOSPITAL_COMMUNITY)
Admission: EM | Admit: 2020-05-17 | Discharge: 2020-05-17 | Disposition: A | Discharge status: Home / Self Care | Payer: 59 | Attending: Family Medicine | Admitting: Family Medicine

## 2020-05-17 ENCOUNTER — Encounter (HOSPITAL_COMMUNITY): Payer: Self-pay

## 2020-05-17 DIAGNOSIS — Z20822 Contact with and (suspected) exposure to covid-19: Secondary | ICD-10-CM | POA: Diagnosis not present

## 2020-05-17 NOTE — Discharge Instructions (Addendum)
We have tested you for Covid  you can check your my chart for results.  Follow up as needed for continued or worsening symptoms

## 2020-05-17 NOTE — ED Triage Notes (Signed)
Patient here for covid test for travel.

## 2020-05-17 NOTE — ED Provider Notes (Signed)
[place]    CSN: [id number] Arrival date & time: 05/17/20  0908      History   Chief Complaint Chief Complaint  Patient presents with  . COVID test for Travel    HPI Donna Perkins is a 29 y.o. female.   Patient is a 29 year old female presents today for hypertensive travel.  She is planning to travel on Thursday.  She is currently asymptomatic     History reviewed. No pertinent past medical history.  There are no problems to display for this patient.   Past Surgical History:  Procedure Laterality Date  . CESAREAN SECTION      OB History   No obstetric history on file.      Home Medications    Prior to Admission medications   Medication Sig Start Date End Date Taking? Authorizing Provider  acetaminophen (TYLENOL) 500 MG tablet Take 1 tablet (500 mg total) by mouth every 6 (six) hours as needed. 05/31/17   Gareth Morgan, MD  benzonatate (TESSALON) 100 MG capsule Take 1 capsule (100 mg total) by mouth 3 (three) times daily as needed for cough. 05/31/17   Gareth Morgan, MD  ibuprofen (ADVIL,MOTRIN) 400 MG tablet Take 1 tablet (400 mg total) by mouth every 6 (six) hours as needed. 05/31/17   Gareth Morgan, MD  metroNIDAZOLE (FLAGYL) 500 MG tablet Take 1 tablet (500 mg total) by mouth 2 (two) times daily for 7 days. 05/10/20 05/17/20  Wieters, Hallie C, PA-C  ondansetron (ZOFRAN ODT) 4 MG disintegrating tablet Take 1 tablet (4 mg total) by mouth every 8 (eight) hours as needed for nausea or vomiting. 05/31/17   Gareth Morgan, MD    Family History Family History  Family history unknown: Yes    Social History Social History   Tobacco Use  . Smoking status: Former Research scientist (life sciences)  . Smokeless tobacco: Never Used  Substance Use Topics  . Alcohol use: Yes    Comment: occ  . Drug use: Yes    Types: Marijuana    Comment: occ     Allergies   Patient has no known allergies.   Review of Systems Review of Systems   Physical Exam Triage Vital  Signs ED Triage Vitals  Enc Vitals Group     BP 05/17/20 0937 112/71     Pulse Rate 05/17/20 0937 69     Resp 05/17/20 0937 16     Temp 05/17/20 0937 98.2 F (36.8 C)     Temp Source 05/17/20 0937 Oral     SpO2 05/17/20 0937 100 %     Weight --      Height --      Head Circumference --      Peak Flow --      Pain Score 05/17/20 0939 0     Pain Loc --      Pain Edu? --      Excl. in [place]? --    No data found.  Updated Vital Signs BP 112/71 (BP Location: Left Arm)   Pulse 69   Temp 98.2 F (36.8 C) (Oral)   Resp 16   LMP 04/20/2020   SpO2 100%   Visual Acuity Right Eye Distance:   Left Eye Distance:   Bilateral Distance:    Right Eye Near:   Left Eye Near:    Bilateral Near:     Physical Exam Vitals and nursing note reviewed.  Constitutional:      General: She is not in acute distress.  Appearance: Normal appearance. She is not ill-appearing, toxic-appearing or diaphoretic.  HENT:     Head: Normocephalic.     Nose: Nose normal.  Eyes:     Conjunctiva/sclera: Conjunctivae normal.  Pulmonary:     Effort: Pulmonary effort is normal.  Musculoskeletal:        General: Normal range of motion.     Cervical back: Normal range of motion.  Skin:    General: Skin is warm and dry.     Findings: No rash.  Neurological:     Mental Status: She is alert.  Psychiatric:        Mood and Affect: Mood normal.      UC Treatments / Results  Labs (all labs ordered are listed, but only abnormal results are displayed) Labs Reviewed  SARS CORONAVIRUS 2 (TAT 6-24 HRS)    EKG   Radiology No results found.  Procedures Procedures (including critical care time)  Medications Ordered in UC Medications - No data to display  Initial Impression / Assessment and Plan / UC Course  I have reviewed the triage vital signs and the nursing notes.  Pertinent labs & imaging results that were available during my care of the patient were reviewed by me and considered in my  medical decision making (see chart for details).     Encounter for testing for Covid Swab sent for testing Final Clinical Impressions(s) / UC Diagnoses   Final diagnoses:  Encounter for laboratory testing for COVID-19 virus     Discharge Instructions     We have tested you for Covid  you can check your my chart for results.  Follow up as needed for continued or worsening symptoms     ED Prescriptions    None     PDMP not reviewed this encounter.   Dahlia Byes A, NP 05/17/20 1039

## 2020-05-18 LAB — SARS CORONAVIRUS 2 (TAT 6-24 HRS): SARS Coronavirus 2: NEGATIVE

## 2020-08-11 ENCOUNTER — Other Ambulatory Visit: Payer: Self-pay

## 2020-08-11 ENCOUNTER — Ambulatory Visit (HOSPITAL_COMMUNITY)
Admission: EM | Admit: 2020-08-11 | Discharge: 2020-08-11 | Disposition: A | Discharge status: Home / Self Care | Payer: 59 | Attending: Family Medicine | Admitting: Family Medicine

## 2020-08-11 ENCOUNTER — Encounter (HOSPITAL_COMMUNITY): Payer: Self-pay

## 2020-08-11 DIAGNOSIS — M79644 Pain in right finger(s): Secondary | ICD-10-CM

## 2020-08-11 DIAGNOSIS — L03011 Cellulitis of right finger: Secondary | ICD-10-CM

## 2020-08-11 MED ORDER — CEPHALEXIN 500 MG PO CAPS: 500.0000 mg | ORAL_CAPSULE | Freq: Three times a day (TID) | ORAL | 0 refills | Status: AC

## 2020-08-11 NOTE — ED Triage Notes (Signed)
Pt c/o pain/swelling to right index finger for two days. Pt states she bit a "hangnail" off and then finger became red/painful. Border of nailbed with erythema/edema. Denies drainage, fever, chills. Has been using warm water soaks.

## 2020-08-11 NOTE — Discharge Instructions (Addendum)
Recommend start Keflex 500mg  3 times a day for 7 days- finish all medication even if swelling and infection has resolved. Continue warm soaks to area for comfort. May take Ibuprofen 600mg  every 8 hours as needed for pain and swelling. Follow-up in 3 to 4 days if not improving.

## 2020-08-11 NOTE — ED Provider Notes (Signed)
[facility]    CSN: [id number] Arrival date & time: 08/11/20  1601      History   Chief Complaint Chief Complaint  Patient presents with  . Hand Pain    HPI Donna Perkins is a 29 y.o. female.   29 year old female presents with right index finger pain and swelling that started 2 days ago. She had a hangnail and "extra skin" along the side of her fingernail that she pulled and bit off about 3 days ago. 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 Yesterday she woke up with more pain and swelling. No discharge from area. She has been trying to soak her finger with minimal relief. She has not taken any medication for pain. Denies any fever, chills or GI symptoms. No other chronic health issues. Takes no daily medication.   The history is provided by the patient.    History reviewed. No pertinent past medical history.  There are no problems to display for this patient.   Past Surgical History:  Procedure Laterality Date  . CESAREAN SECTION      OB History   No obstetric history on file.      Home Medications    Prior to Admission medications   Medication Sig Start Date End Date Taking? Authorizing Provider  cephALEXin (KEFLEX) 500 MG capsule Take 1 capsule (500 mg total) by mouth 3 (three) times daily for 7 days. 08/11/20 08/18/20  Sudie Grumbling, NP    Family History Family History  Family history unknown: Yes    Social History Social History   Tobacco Use  . Smoking status: Former Games developer  . Smokeless tobacco: Never Used  Substance Use Topics  . Alcohol use: Yes    Comment: occ  . Drug use: Yes    Types: Marijuana    Comment: occ     Allergies   Patient has no known allergies.   Review of Systems Review of Systems  Constitutional: Negative for activity change, appetite change, chills, fatigue and fever.  Gastrointestinal: Negative for nausea and vomiting.    Musculoskeletal: Positive for arthralgias and joint swelling.  Skin: Positive for color change and wound. Negative for rash.  Allergic/Immunologic: Negative for environmental allergies, food allergies and immunocompromised state.  Neurological: Negative for dizziness, syncope, weakness, light-headedness, numbness and headaches.  Hematological: Negative for adenopathy. Does not bruise/bleed easily.     Physical Exam Triage Vital Signs ED Triage Vitals  Enc Vitals Group     BP 08/11/20 0931 120/69     Pulse Rate 08/11/20 0931 76     Resp 08/11/20 0931 18     Temp 08/11/20 0931 98.4 F (36.9 C)     Temp Source 08/11/20 0931 Oral     SpO2 08/11/20 0931 100 %     Weight --      Height --      Head Circumference --      Peak Flow --      Pain Score 08/11/20 0932 10     Pain Loc --      Pain Edu? --      Excl. in GC? --    No data found.  Updated Vital Signs BP 120/69 (BP Location: Left Arm)   Pulse 76   Temp 98.4 F (36.9 C) (Oral)   Resp 18   LMP 07/17/2020   SpO2 100%   Visual  Acuity Right Eye Distance:   Left Eye Distance:   Bilateral Distance:    Right Eye Near:   Left Eye Near:    Bilateral Near:     Physical Exam Vitals and nursing note reviewed.  Constitutional:      General: She is awake. She is not in acute distress.    Appearance: She is well-developed and well-groomed. She is not ill-appearing.     Comments: She is sitting comfortably on the exam table in no acute distress.   HENT:     Head: Normocephalic and atraumatic.     Right Ear: External ear normal.     Left Ear: External ear normal.  Eyes:     Extraocular Movements: Extraocular movements intact.     Conjunctiva/sclera: Conjunctivae normal.  Cardiovascular:     Rate and Rhythm: Normal rate.  Pulmonary:     Effort: Pulmonary effort is normal.  Musculoskeletal:        General: Swelling and tenderness present.     Right hand: Swelling and tenderness present. No deformity. Decreased range  of motion. Normal sensation. There is no disruption of two-point discrimination. Normal capillary refill. Normal pulse.     Left hand: Normal.       Hands:     Cervical back: Normal range of motion.     Comments: Medial side of nail of right index finger is red, swollen and very tender. Base of nail is also red and swollen but minimal tenderness. Not able to express any discharge and area feels more firm. Unable to fully bend tip of finger due to swelling. Localized symptoms. Normal sensation and good distal pulses. No neuro deficits noted.   Skin:    General: Skin is warm and dry.     Capillary Refill: Capillary refill takes less than 2 seconds.     Findings: Erythema present. No bruising or ecchymosis.  Neurological:     General: No focal deficit present.     Mental Status: She is alert and oriented to person, place, and time.     Sensory: Sensation is intact. No sensory deficit.     Motor: Motor function is intact.  Psychiatric:        Mood and Affect: Mood normal.        Behavior: Behavior normal. Behavior is cooperative.        Thought Content: Thought content normal.        Judgment: Judgment normal.      UC Treatments / Results  Labs (all labs ordered are listed, but only abnormal results are displayed) Labs Reviewed - No data to display  EKG   Radiology No results found.  Procedures Procedures (including critical care time)  Medications Ordered in UC Medications - No data to display  Initial Impression / Assessment and Plan / UC Course  I have reviewed the triage vital signs and the nursing notes.  Pertinent labs & imaging results that were available during my care of the patient were reviewed by me and considered in my medical decision making (see chart for details).    Reviewed with patient that she has paronychia of her right index finger/mild cellulitis. Area firm and no I & D at this time. Will treat with oral antibiotics. Start Keflex 500mg  3 times a day  as directed. Continue warm soaks to area for comfort. May take Ibuprofen 600mg  every 8 hours as needed for pain and swelling. Note written for work. Follow-up in 3 to 4 days if not  improving.   Final Clinical Impressions(s) / UC Diagnoses   Final diagnoses:  Paronychia of right index finger  Finger pain, right     Discharge Instructions     Recommend start Keflex 500mg  3 times a day for 7 days- finish all medication even if swelling and infection has resolved. Continue warm soaks to area for comfort. May take Ibuprofen 600mg  every 8 hours as needed for pain and swelling. Follow-up in 3 to 4 days if not improving.     ED Prescriptions    Medication Sig Dispense Auth. Provider   cephALEXin (KEFLEX) 500 MG capsule Take 1 capsule (500 mg total) by mouth 3 (three) times daily for 7 days. 21 capsule Virgilio Broadhead, , NP     PDMP not reviewed this encounter.   , NP 08/12/20 1154

## 2020-09-08 ENCOUNTER — Encounter (HOSPITAL_COMMUNITY): Payer: Self-pay | Admitting: Emergency Medicine

## 2020-09-08 ENCOUNTER — Other Ambulatory Visit: Payer: Self-pay

## 2020-09-08 ENCOUNTER — Ambulatory Visit (HOSPITAL_COMMUNITY)
Admission: EM | Admit: 2020-09-08 | Discharge: 2020-09-08 | Disposition: A | Discharge status: Home / Self Care | Payer: 59 | Attending: Family Medicine | Admitting: Family Medicine

## 2020-09-08 DIAGNOSIS — N760 Acute vaginitis: Secondary | ICD-10-CM | POA: Diagnosis not present

## 2020-09-08 HISTORY — DX: Acute vaginitis: N76.0

## 2020-09-08 HISTORY — DX: Other specified bacterial agents as the cause of diseases classified elsewhere: B96.89

## 2020-09-08 MED ORDER — METRONIDAZOLE 500 MG PO TABS: 500.0000 mg | ORAL_TABLET | Freq: Two times a day (BID) | ORAL | 0 refills | Status: AC

## 2020-09-08 NOTE — Discharge Instructions (Addendum)
Take metronidazole as prescribed. Insert Boric acid day 1, then day 7 and then 7 days following in efforts to reduce recurrence  of BV.

## 2020-09-08 NOTE — ED Provider Notes (Signed)
[facility]    CSN: [id number] Arrival date & time: 09/08/20  1220      History   Chief Complaint Chief Complaint  Patient presents with  . Vaginitis    HPI Donna Perkins is a 29 y.o. female.   HPI  Patient presents with concern of increased vaginal discharge  x5 to 6 days.  She is not worried regarding STDs.  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 Denies vaginal pain and endorses irritation.  No history of diabetes. Past Medical History:  Diagnosis Date  . Bacterial vaginosis     There are no problems to display for this patient.   Past Surgical History:  Procedure Laterality Date  . CESAREAN SECTION      OB History   No obstetric history on file.      Home Medications    Prior to Admission medications   Not on File    Family History Family History  Family history unknown: Yes    Social History Social History   Tobacco Use  . Smoking status: Former Games developer  . Smokeless tobacco: Never Used  Substance Use Topics  . Alcohol use: Yes    Comment: occ  . Drug use: Yes    Types: Marijuana    Comment: occ     Allergies   Patient has no known allergies.   Review of Systems Review of Systems Pertinent negatives listed in HPI Physical Exam Triage Vital Signs ED Triage Vitals  Enc Vitals Group     BP 09/08/20 1446 114/60     Pulse Rate 09/08/20 1446 66     Resp 09/08/20 1446 15     Temp 09/08/20 1446 98 F (36.7 C)     Temp Source 09/08/20 1446 Oral     SpO2 09/08/20 1446 100 %     Weight --      Height --      Head Circumference --      Peak Flow --      Pain Score 09/08/20 1444 0     Pain Loc --      Pain Edu? --      Excl. in GC? --    No data found.  Updated Vital Signs BP 114/60 (BP Location: Right Arm)   Pulse 66   Temp 98 F (36.7 C) (Oral)   Resp 15   LMP 08/19/2020   SpO2 100%   Visual Acuity Right  Eye Distance:   Left Eye Distance:   Bilateral Distance:    Right Eye Near:   Left Eye Near:    Bilateral Near:     Physical Exam General appearance: alert, well developed, well nourished, cooperative and in no distress Head: Normocephalic, without obvious abnormality, atraumatic Respiratory: Respirations even and unlabored, normal respiratory rate Heart: rate and rhythm normal. Extremities: No gross deformities Skin: Skin color, texture, turgor normal. No rashes seen  Psych: Appropriate mood and affect. Vaginal cytology self-collected. UC Treatments / Results  Labs (all labs ordered are listed, but only abnormal results are displayed) Labs Reviewed - No data to display  EKG   Radiology No results found.  Procedures Procedures (including critical care time)  Medications Ordered in UC Medications - No data to display  Initial Impression / Assessment and Plan / UC Course  I have reviewed the triage vital signs and the nursing notes.  Pertinent labs & imaging results  that were available during my care of the patient were reviewed by me and considered in my medical decision making (see chart for details).    Acute vaginitis, metronidazole and diflucan prescribed. Vaginal cytology pending. Patient advised ok to continue boric acid as recommended. If no improvement would recommend follow-up with OBGYN. Final Clinical Impressions(s) / UC Diagnoses   Final diagnoses:  Vaginitis and vulvovaginitis     Discharge Instructions     Take metronidazole as prescribed. Insert Boric acid day 1, then day 7 and then 7 days following in efforts to reduce recurrence  of BV.      ED Prescriptions    Medication Sig Dispense Auth. Provider   metroNIDAZOLE (FLAGYL) 500 MG tablet Take 1 tablet (500 mg total) by mouth 2 (two) times daily with a meal. DO NOT CONSUME ALCOHOL WHILE TAKING THIS MEDICATION. 14 tablet Bing Neighbors, FNP     PDMP not reviewed this encounter.   Bing Neighbors, FNP 09/12/20 [phone]

## 2020-09-08 NOTE — ED Triage Notes (Signed)
Pt c/o vaginal discharge with odor x 5-6 days. Pt denies vaginal bleeding or itching.

## 2020-09-09 LAB — CERVICOVAGINAL ANCILLARY ONLY
Bacterial Vaginitis (gardnerella): POSITIVE — AB
Candida Glabrata: NEGATIVE
Candida Vaginitis: NEGATIVE
Chlamydia: NEGATIVE
Comment: NEGATIVE
Comment: NEGATIVE
Comment: NEGATIVE
Comment: NEGATIVE
Comment: NEGATIVE
Comment: NORMAL
Neisseria Gonorrhea: NEGATIVE
Trichomonas: NEGATIVE

## 2020-09-29 ENCOUNTER — Other Ambulatory Visit: Payer: Self-pay

## 2020-09-29 ENCOUNTER — Other Ambulatory Visit: Payer: Self-pay | Admitting: Family Medicine

## 2020-09-29 ENCOUNTER — Ambulatory Visit (HOSPITAL_COMMUNITY)
Admission: EM | Admit: 2020-09-29 | Discharge: 2020-09-29 | Disposition: A | Discharge status: Home / Self Care | Payer: 59 | Attending: Family Medicine | Admitting: Family Medicine

## 2020-09-29 ENCOUNTER — Encounter (HOSPITAL_COMMUNITY): Payer: Self-pay | Admitting: Emergency Medicine

## 2020-09-29 DIAGNOSIS — N760 Acute vaginitis: Secondary | ICD-10-CM | POA: Insufficient documentation

## 2020-09-29 DIAGNOSIS — B9689 Other specified bacterial agents as the cause of diseases classified elsewhere: Secondary | ICD-10-CM | POA: Insufficient documentation

## 2020-09-29 MED ORDER — CLINDAMYCIN HCL 150 MG PO CAPS: 150.0000 mg | ORAL_CAPSULE | Freq: Two times a day (BID) | ORAL | 0 refills | Status: AC

## 2020-09-29 NOTE — ED Provider Notes (Signed)
[facility]    CSN: [id number] Arrival date & time: 09/29/20  0920      History   Chief Complaint Chief Complaint  Patient presents with   Vaginal Discharge    HPI Donna Perkins is a 29 y.o. female.   Here today with about a week of recurring vaginal odor, irritation and discharge. States she's been dealing with recurrent BV for quite some time now and this feels consistent. Ordered some metronidazole pills online and is nearly finished with those but not getting relief like she would typically get from that. Denies pelvic pain, back pain, fever, chills, N/V, rashes, concern for STIs, chance of pregnancy. Does not use scented soaps or take baths.      Past Medical History:  Diagnosis Date   Bacterial vaginosis     There are no problems to display for this patient.   Past Surgical History:  Procedure Laterality Date   CESAREAN SECTION      OB History   No obstetric history on file.      Home Medications    Prior to Admission medications   Medication Sig Start Date End Date Taking? Authorizing Provider  clindamycin (CLEOCIN) 150 MG capsule Take 1 capsule (150 mg total) by mouth 2 (two) times daily. 09/29/20   Particia Nearing, PA-C  metroNIDAZOLE (FLAGYL) 500 MG tablet Take 1 tablet (500 mg total) by mouth 2 (two) times daily with a meal. DO NOT CONSUME ALCOHOL WHILE TAKING THIS MEDICATION. 09/08/20   Bing Neighbors, FNP    Family History Family History  Family history unknown: Yes    Social History Social History   Tobacco Use   Smoking status: Former Smoker   Smokeless tobacco: Never Used  Substance Use Topics   Alcohol use: Yes    Comment: occ   Drug use: Yes    Types: Marijuana    Comment: occ     Allergies   Patient has no known allergies.   Review of Systems Review of Systems PER HPI   Physical Exam Triage Vital Signs ED Triage Vitals  Enc Vitals Group     BP 09/29/20 1010 106/74     Pulse Rate  09/29/20 1010 70     Resp 09/29/20 1010 15     Temp 09/29/20 1010 98.2 F (36.8 C)     Temp Source 09/29/20 1010 Oral     SpO2 09/29/20 1010 100 %     Weight --      Height --      Head Circumference --      Peak Flow --      Pain Score 09/29/20 1009 0     Pain Loc --      Pain Edu? --      Excl. in GC? --    No data found.  Updated Vital Signs BP 106/74 (BP Location: Right Arm)    Pulse 70    Temp 98.2 F (36.8 C) (Oral)    Resp 15    SpO2 100%   Visual Acuity Right Eye Distance:   Left Eye Distance:   Bilateral Distance:    Right Eye Near:   Left Eye Near:    Bilateral Near:     Physical Exam Vitals and nursing note reviewed.  Constitutional:      Appearance: Normal appearance. She is not ill-appearing.  HENT:     Head: Atraumatic.  Eyes:     Extraocular Movements: Extraocular movements intact.  Conjunctiva/sclera: Conjunctivae normal.  Cardiovascular:     Rate and Rhythm: Normal rate and regular rhythm.     Heart sounds: Normal heart sounds.  Pulmonary:     Effort: Pulmonary effort is normal.     Breath sounds: Normal breath sounds.  Abdominal:     General: Bowel sounds are normal. There is no distension.     Palpations: Abdomen is soft.     Tenderness: There is no abdominal tenderness. There is no right CVA tenderness, left CVA tenderness or guarding.  Genitourinary:    Comments: GU exam deferred, self swab performed Musculoskeletal:        General: Normal range of motion.     Cervical back: Normal range of motion and neck supple.  Skin:    General: Skin is warm and dry.  Neurological:     Mental Status: She is alert and oriented to person, place, and time.  Psychiatric:        Mood and Affect: Mood normal.        Thought Content: Thought content normal.        Judgment: Judgment normal.      UC Treatments / Results  Labs (all labs ordered are listed, but only abnormal results are displayed) Labs Reviewed  CERVICOVAGINAL ANCILLARY ONLY     EKG   Radiology No results found.  Procedures Procedures (including critical care time)  Medications Ordered in UC Medications - No data to display  Initial Impression / Assessment and Plan / UC Course  I have reviewed the triage vital signs and the nursing notes.  Pertinent labs & imaging results that were available during my care of the patient were reviewed by me and considered in my medical decision making (see chart for details).     APtima swab pending, suspect recurrent BV causing these sxs. Will switch to clindamycin, start probiotic and boric acid regimen at home and continue good vaginal hygiene practices. F/u if sxs not resolving.   Final Clinical Impressions(s) / UC Diagnoses   Final diagnoses:  Acute vaginitis  BV (bacterial vaginosis)   Discharge Instructions   None    ED Prescriptions    Medication Sig Dispense Auth. Provider   clindamycin (CLEOCIN) 150 MG capsule Take 1 capsule (150 mg total) by mouth 2 (two) times daily. 14 capsule Particia Nearing, [place]     PDMP not reviewed this encounter.   Particia Nearing, [place] 09/29/20 1107

## 2020-09-29 NOTE — ED Triage Notes (Signed)
Pt c/o vaginal discharge and odor x 9 days. Pt states she had a prescription for metronizaole and has 3 pills left to take, she states she brought them online. Pt states she is still having symptoms and no relief.

## 2020-09-30 LAB — CERVICOVAGINAL ANCILLARY ONLY
Bacterial Vaginitis (gardnerella): POSITIVE — AB
Candida Glabrata: NEGATIVE
Candida Vaginitis: NEGATIVE
Chlamydia: NEGATIVE
Comment: NEGATIVE
Comment: NEGATIVE
Comment: NEGATIVE
Comment: NEGATIVE
Comment: NEGATIVE
Comment: NORMAL
Neisseria Gonorrhea: NEGATIVE
Trichomonas: NEGATIVE

## 2022-03-27 ENCOUNTER — Ambulatory Visit (HOSPITAL_COMMUNITY)
Admission: EM | Admit: 2022-03-27 | Discharge: 2022-03-27 | Disposition: A | Discharge status: Home / Self Care | Payer: Medicaid Other | Attending: Nurse Practitioner | Admitting: Nurse Practitioner

## 2022-03-27 ENCOUNTER — Other Ambulatory Visit: Payer: Self-pay

## 2022-03-27 ENCOUNTER — Encounter (HOSPITAL_COMMUNITY): Payer: Self-pay | Admitting: Emergency Medicine

## 2022-03-27 DIAGNOSIS — H6982 Other specified disorders of Eustachian tube, left ear: Secondary | ICD-10-CM

## 2022-03-27 MED ORDER — FLUTICASONE PROPIONATE 50 MCG/ACT NA SUSP: 2.0000 | Freq: Every day | NASAL | 0 refills | Status: AC

## 2022-03-27 MED ORDER — CETIRIZINE HCL 10 MG PO TABS: 10.0000 mg | ORAL_TABLET | Freq: Every day | ORAL | 0 refills | Status: AC

## 2022-03-27 NOTE — Discharge Instructions (Addendum)
Take medication as prescribed. ?Warm compresses to the affected ear to help with pain or discomfort. ?May take over-the-counter ibuprofen as needed for pain, fever, or general discomfort. ?Try not to stick anything into the ear canal to avoid perforating the eardrum. ?Follow-up in 10 to 14 days if symptoms worsen or do not improve. ?

## 2022-03-27 NOTE — ED Provider Notes (Signed)
?[facility] ? ? ? ?CSN: [id number] ?Arrival date & time: 03/27/22  0931 ? ? ?  ? ?History   ?Chief Complaint ?Chief Complaint  ?Patient presents with  ? Otalgia  ? ? ?HPI ?Donna Perkins is a 31 y.o. female.  ? ?The patient is a 31 year old female who presents with left ear pain.  Pain has been intermittent for the past week.  She states she also has itching in the ear and has also had headache.  She states the pain is described as sharp and shooting.  She states that she does use objects to clean her ears and when they itch.  She also states that when she uses a Q-tip to clean her ears she notices the Q-tip has more fluid on it versus earwax.  She denies any history of seasonal allergies.  She also states that she has dryness in the left side of her throat.  Patient also has an area under the left ear that she has noticed since her symptoms started.  Patient states she has been using an item from [employer] that she ordered to look in her ear. ? ? ?Otalgia ?Location:  Left ?Behind ear:  No abnormality ?Quality:  Sharp and shooting ?Timing:  Intermittent ?Progression:  Waxing and waning ?Context: not direct blow, not recent URI and not water in ear   ?Relieved by:  None tried ?Ineffective treatments:  None tried ?Associated symptoms: ear discharge (clear) and sore throat   ?Associated symptoms: no congestion, no fever, no headaches, no hearing loss, no neck pain and no rhinorrhea   ? ?Past Medical History:  ?Diagnosis Date  ? Bacterial vaginosis   ? ? ?There are no problems to display for this patient. ? ? ?Past Surgical History:  ?Procedure Laterality Date  ? CESAREAN SECTION    ? ? ?OB History   ?No obstetric history on file. ?  ? ? ? ?Home Medications   ? ?Prior to Admission medications   ?Medication Sig Start Date End Date Taking? Authorizing Provider  ?clindamycin (CLEOCIN) 150 MG capsule Take 1 capsule (150 mg total) by mouth 2 (two) times daily. ?Patient not taking: Reported on 03/27/2022 09/29/20   Particia NearingLane,  Rachel Elizabeth, PA-C  ?metroNIDAZOLE (FLAGYL) 500 MG tablet Take 1 tablet (500 mg total) by mouth 2 (two) times daily with a meal. DO NOT CONSUME ALCOHOL WHILE TAKING THIS MEDICATION. ?Patient not taking: Reported on 03/27/2022 09/08/20   Bing NeighborsHarris, Kimberly S, FNP  ? ? ?Family History ?Family History  ?Problem Relation Age of Onset  ? Diabetes Mother   ? Alcoholism Father   ? ? ?Social History ?Social History  ? ?Tobacco Use  ? Smoking status: Former  ? Smokeless tobacco: Never  ?Vaping Use  ? Vaping Use: Some days  ?Substance Use Topics  ? Alcohol use: Yes  ?  Comment: occ  ? Drug use: Yes  ?  Types: Marijuana  ?  Comment: occ  ? ? ? ?Allergies   ?Patient has no known allergies. ? ? ?Review of Systems ?Review of Systems  ?Constitutional: Negative.  Negative for fatigue and fever.  ?HENT:  Positive for ear discharge (clear), ear pain and sore throat. Negative for congestion, facial swelling, hearing loss, postnasal drip, rhinorrhea, sinus pressure and sneezing.   ?Eyes: Negative.   ?Cardiovascular: Negative.   ?Musculoskeletal:  Negative for neck pain.  ?Skin: Negative.   ?Neurological:  Negative for headaches.  ?Psychiatric/Behavioral: Negative.    ? ? ?Physical Exam ?Triage Vital Signs ?ED  Triage Vitals  ?Enc Vitals Group  ?   BP 03/27/22 1058 (!) 105/41  ?   Pulse Rate 03/27/22 1058 74  ?   Resp 03/27/22 1058 18  ?   Temp 03/27/22 1058 98.3 ?F (36.8 ?C)  ?   Temp Source 03/27/22 1058 Oral  ?   SpO2 03/27/22 1058 100 %  ?   Weight --   ?   Height --   ?   Head Circumference --   ?   Peak Flow --   ?   Pain Score 03/27/22 1053 10  ?   Pain Loc --   ?   Pain Edu? --   ?   Excl. in GC? --   ? ?No data found. ? ?Updated Vital Signs ?BP (!) 105/41 (BP Location: Right Arm) Comment (BP Location): large cuff  Pulse 74   Temp 98.3 ?F (36.8 ?C) (Oral)   Resp 18   LMP 03/10/2022 (Exact Date)   SpO2 100%  ? ?Visual Acuity ?Right Eye Distance:   ?Left Eye Distance:   ?Bilateral Distance:   ? ?Right Eye Near:   ?Left Eye  Near:    ?Bilateral Near:    ? ?Physical Exam ?Vitals reviewed.  ?Constitutional:   ?   Appearance: Normal appearance.  ?HENT:  ?   Head: Normocephalic and atraumatic.  ?   Right Ear: Tympanic membrane, ear canal and external ear normal.  ?   Left Ear: Ear canal and external ear normal. A middle ear effusion is present.  ?   Nose: Nose normal.  ?   Mouth/Throat:  ?   Mouth: Mucous membranes are moist.  ?Eyes:  ?   Extraocular Movements: Extraocular movements intact.  ?   Conjunctiva/sclera: Conjunctivae normal.  ?   Pupils: Pupils are equal, round, and reactive to light.  ?Cardiovascular:  ?   Rate and Rhythm: Normal rate and regular rhythm.  ?   Pulses: Normal pulses.  ?   Heart sounds: Normal heart sounds.  ?Pulmonary:  ?   Effort: Pulmonary effort is normal.  ?   Breath sounds: Normal breath sounds.  ?Abdominal:  ?   General: Bowel sounds are normal.  ?   Palpations: Abdomen is soft.  ?Musculoskeletal:  ?   Cervical back: Normal range of motion.  ?Skin: ?   General: Skin is warm and dry.  ?Neurological:  ?   Mental Status: She is alert and oriented to person, place, and time.  ?Psychiatric:     ?   Mood and Affect: Mood normal.     ?   Behavior: Behavior normal.  ? ? ? ?UC Treatments / Results  ?Labs ?(all labs ordered are listed, but only abnormal results are displayed) ?Labs Reviewed - No data to display ? ?EKG ? ? ?Radiology ?No results found. ? ?Procedures ?Procedures (including critical care time) ? ?Medications Ordered in UC ?Medications - No data to display ? ?Initial Impression / Assessment and Plan / UC Course  ?I have reviewed the triage vital signs and the nursing notes. ? ?Pertinent labs & imaging results that were available during my care of the patient were reviewed by me and considered in my medical decision making (see chart for details). ? ?The patient is a 31 year old female who presents with left ear pain.  Symptoms have been present for the past week.  She denies fever, chills, or other upper  respiratory symptoms.  Patient states that she does notice fluid on the Q-tip  when she cleans her ear.  On exam, patient does have a middle ear effusion on the left side.  No signs of infection to include an erythematous TM or bulging TM.  We will start the patient on fluticasone and Zyrtec.  Discussed with the patient how the medications will work.  Also recommended supportive care to include warm compresses to the affected area.  Patient advised that she may take ibuprofen as needed, which will also help with inflammation.  Patient advised to follow-up if symptoms do not improve. ?Final Clinical Impressions(s) / UC Diagnoses  ? ?Final diagnoses:  ?None  ? ?Discharge Instructions   ?None ?  ? ?ED Prescriptions   ?None ?  ? ?PDMP not reviewed this encounter. ?  ?Abran Cantor, NP ?03/27/22 1118 ? ?

## 2022-03-27 NOTE — ED Triage Notes (Signed)
Patient reports having a history of ear issues.  Left ear has been aching for a week.  Patient admits to using different objects to scratch ears and has bought a piece of equipment to look in ears.  Patient has intermittent ear pain, stabbing pain.  Complains of throat feeling dry and some soreness on left side of throat ?

## 2022-03-27 NOTE — ED Notes (Signed)
Printed discharge papers ?

## 2022-06-25 ENCOUNTER — Emergency Department (HOSPITAL_COMMUNITY)
Admission: EM | Admit: 2022-06-25 | Discharge: 2022-06-26 | Disposition: A | Discharge status: Home / Self Care | Payer: Medicaid Other | Attending: Physician Assistant | Admitting: Physician Assistant

## 2022-06-25 ENCOUNTER — Emergency Department (HOSPITAL_COMMUNITY): Payer: Medicaid Other

## 2022-06-25 ENCOUNTER — Other Ambulatory Visit: Payer: Self-pay

## 2022-06-25 ENCOUNTER — Encounter (HOSPITAL_COMMUNITY): Payer: Self-pay

## 2022-06-25 DIAGNOSIS — R0981 Nasal congestion: Secondary | ICD-10-CM | POA: Diagnosis not present

## 2022-06-25 DIAGNOSIS — R053 Chronic cough: Secondary | ICD-10-CM | POA: Insufficient documentation

## 2022-06-25 DIAGNOSIS — R1110 Vomiting, unspecified: Secondary | ICD-10-CM | POA: Insufficient documentation

## 2022-06-25 DIAGNOSIS — R519 Headache, unspecified: Secondary | ICD-10-CM | POA: Diagnosis not present

## 2022-06-25 DIAGNOSIS — J029 Acute pharyngitis, unspecified: Secondary | ICD-10-CM | POA: Diagnosis not present

## 2022-06-25 DIAGNOSIS — Z20822 Contact with and (suspected) exposure to covid-19: Secondary | ICD-10-CM | POA: Insufficient documentation

## 2022-06-25 DIAGNOSIS — R058 Other specified cough: Secondary | ICD-10-CM

## 2022-06-25 DIAGNOSIS — R059 Cough, unspecified: Secondary | ICD-10-CM | POA: Diagnosis present

## 2022-06-25 LAB — RESP PANEL BY RT-PCR (FLU A&B, COVID) ARPGX2
Influenza A by PCR: NEGATIVE
Influenza B by PCR: NEGATIVE
SARS Coronavirus 2 by RT PCR: NEGATIVE

## 2022-06-25 NOTE — ED Triage Notes (Signed)
Pt reports cough, onset a week ago. She reports she is coughing so much it is causing her to vomit up her food. Worse at night. She denies pain. Had a sore throat and nasal congestion as well but states those symptoms went away.

## 2022-06-25 NOTE — ED Provider Triage Note (Signed)
Emergency Medicine Provider Triage Evaluation Note  Donna Perkins , a 31 y.o. female  was evaluated in triage.  Pt complains of cough.  She reports that she has a tickle in her throat.  She did have a sore throat and nasal congestion.  She reports that they she has had post tussive emesis.  She denies abdominal pain or nausea.  She has vomited multiple times today  She denies fevers.  No known sick contacts. No shortness of breath.   Physical Exam  BP 119/72 (BP Location: Right Arm)   Pulse 97   Temp 99.3 F (37.4 C) (Oral)   Resp 16   Ht 5\' 2"  (1.575 m)   Wt 90.7 kg   LMP 05/02/2022 (Approximate)   SpO2 98%   BMI 36.58 kg/m  Gen:   Awake, no distress   Resp:  Normal effort  MSK:   Moves extremities without difficulty  Other:  Normal speech.   Medical Decision Making  Medically screening exam initiated at 9:38 PM.  Appropriate orders placed.  Raneem Mendolia was informed that the remainder of the evaluation will be completed by another provider, this initial triage assessment does not replace that evaluation, and the importance of remaining in the ED until their evaluation is complete.  Will check cxr and flu and covid   Abelino Derrick, Cristina Gong 06/25/22 2144

## 2022-06-26 MED ORDER — CEPACOL REGULAR STRENGTH 3 MG MT LOZG: 1.0000 | LOZENGE | OROMUCOSAL | 12 refills | Status: AC | PRN

## 2022-06-26 MED ORDER — BENZONATATE 100 MG PO CAPS: 100.0000 mg | ORAL_CAPSULE | Freq: Three times a day (TID) | ORAL | 0 refills | Status: AC

## 2022-06-26 NOTE — Discharge Instructions (Addendum)
You were seen in the emergency department today for cough.  This is likely postviral cough syndrome which may last for 10 days up to a few weeks after a viral infection.  I have prescribed you Tessalon which is a cough medication as well as lozenges for any sore throat.  You can continue to use warm fluids with honey to help soothe your throat as well as over-the-counter cough and cold medication.  I would suggest elevating the head of bed at night to aid with sleep.  Please return if you have worsening shortness of breath.

## 2022-06-26 NOTE — ED Provider Notes (Signed)
[facility] EMERGENCY DEPARTMENT Provider Note   CSN: [id number] Arrival date & time: 06/25/22  2052     History  Chief Complaint  Patient presents with   Cough    Donna Perkins is a 31 y.o. female.  With this in the past medical history presents emergency department with cough.  Patient states that she has had around 1 week of symptoms that initially started as headache, nasal congestion, sore throat and cough.  She states that all of her symptoms have resolved other than her cough.  She describes it as having a tickling sensation in her throat.  She states that her symptoms are worse in the evening and night when she is trying to go to bed.  She had an episode of posttussive emesis today.  She denies having any fevers, shortness of breath or chest pain.   Cough      Home Medications Prior to Admission medications   Medication Sig Start Date End Date Taking? Authorizing Provider  cetirizine (ZYRTEC) 10 MG tablet Take 1 tablet (10 mg total) by mouth daily. 03/27/22   Leath-Warren, Sadie Haber, NP  clindamycin (CLEOCIN) 150 MG capsule Take 1 capsule (150 mg total) by mouth 2 (two) times daily. Patient not taking: Reported on 03/27/2022 09/29/20   Particia Nearing, PA-C  fluticasone [facility]) 50 MCG/ACT nasal spray Place 2 sprays into both nostrils daily. 03/27/22   Leath-Warren, Sadie Haber, NP  metroNIDAZOLE (FLAGYL) 500 MG tablet Take 1 tablet (500 mg total) by mouth 2 (two) times daily with a meal. DO NOT CONSUME ALCOHOL WHILE TAKING THIS MEDICATION. Patient not taking: Reported on 03/27/2022 09/08/20   Bing Neighbors, FNP      Allergies    Patient has no known allergies.    Review of Systems   Review of Systems  Respiratory:  Positive for cough.   All other systems reviewed and are negative.   Physical Exam Updated Vital Signs BP 119/72 (BP Location: Right Arm)   Pulse 97   Temp 99.3 F (37.4 C) (Oral)   Resp 16   Ht 5\' 2"  (1.575 m)   Wt  90.7 kg   LMP 05/02/2022 (Approximate)   SpO2 98%   BMI 36.58 kg/m  Physical Exam Vitals and nursing note reviewed.  Constitutional:      General: She is not in acute distress.    Appearance: Normal appearance. She is obese. She is not ill-appearing or toxic-appearing.  HENT:     Head: Normocephalic and atraumatic.     Nose: Nose normal.     Mouth/Throat:     Mouth: Mucous membranes are moist.     Pharynx: Oropharynx is clear. Posterior oropharyngeal erythema present.  Eyes:     General: No scleral icterus.    Extraocular Movements: Extraocular movements intact.     Conjunctiva/sclera: Conjunctivae normal.  Pulmonary:     Effort: Pulmonary effort is normal. No respiratory distress.     Breath sounds: Normal breath sounds. No stridor.  Musculoskeletal:     Cervical back: Neck supple.  Skin:    Findings: No rash.  Neurological:     General: No focal deficit present.     Mental Status: She is alert.  Psychiatric:        Mood and Affect: Mood normal.        Behavior: Behavior normal.        Thought Content: Thought content normal.        Judgment: Judgment normal.  ED Results / Procedures / Treatments   Labs (all labs ordered are listed, but only abnormal results are displayed) Labs Reviewed  RESP PANEL BY RT-PCR (FLU A&B, COVID) [id number]   EKG None  Radiology DG Chest 2 View  Result Date: 06/25/2022 CLINICAL DATA:  Cough. EXAM: CHEST - 2 VIEW COMPARISON:  Chest x-ray 06/30/2017 FINDINGS: The heart size and mediastinal contours are within normal limits. Both lungs are clear. The visualized skeletal structures are unremarkable. IMPRESSION: No active cardiopulmonary disease. Electronically Signed   By: Darliss Cheney M.D.   On: 06/25/2022 22:09    Procedures Procedures   Medications Ordered in ED Medications - No data to display  ED Course/ Medical Decision Making/ A&P                           Medical Decision Making This patient presents to the ED for concern  of cough, this involves an extensive number of treatment options, and is a complaint that carries with it a high risk of complications and morbidity.  The differential diagnosis includes pneumonia, viral infection, asthma, GERD, postnasal, CHF, medication, post viral cough syndrome, etc.  Co morbidities that complicate the patient evaluation None  Additional history obtained:  Additional history obtained from: None available External records from outside source obtained and reviewed including: None  Lab Results: I personally ordered, reviewed, and interpreted labs. Pertinent results include: COVID and flu negative  Imaging Studies ordered:  I ordered imaging studies which included x-ray.  I independently reviewed & interpreted imaging & am in agreement with radiology impression. Imaging shows: Chest x-ray without pneumonia or other abnormalities  Medications  -I reviewed the patient's home medications and did not make adjustments. -I did  prescribe new home medications.  Tests Considered: No further testing at this time  Critical Interventions: None required  Consultations: None indicated  SDH None identified  ED Course:  31 year old female who presents emergency department with cough that is most consistent with postviral cough syndrome.  No current fevers, shortness of breath, productive cough concerning for acute bacterial pneumonia.  She is nontoxic in appearance.  She has no history of asthma and symptoms are inconsistent with that.  She has no history of GERD or new medications or congestive heart failure to contribute. COVID and flu testing here is negative.  Previous triage provider ordered chest x-ray which shows no abnormalities.  She has no shortness of breath, chest pain or palpitations.  She is in no acute respiratory distress.  She has a mild erythema of the posterior oropharynx without concern for tonsillitis or airway compromise.  We will prescribe her  lozenges and Tessalon pearls for symptomatic relief.  She is instructed to also use over-the-counter cough and cold medications as needed.  Also instructed to use warm water and honey or warm tea with honey to improve her symptoms as well as elevating the head of bed.  She verbalized understanding.   After consideration of the diagnostic results and the patients response to treatment, I feel that the patent would benefit from discharge. The patient has been appropriately medically screened and/or stabilized in the ED. I have low suspicion for any other emergent medical condition which would require further screening, evaluation or treatment in the ED or require inpatient management. The patient is overall well appearing and non-toxic in appearance. They are hemodynamically stable at time of discharge.   Final Clinical Impression(s) / ED Diagnoses Final diagnoses:  Post-viral cough  syndrome    Rx / DC Orders ED Discharge Orders          Ordered    benzonatate (TESSALON) 100 MG capsule  Every 8 hours        06/26/22 0106    menthol-cetylpyridinium (CEPACOL REGULAR STRENGTH) 3 MG lozenge  As needed        06/26/22 0106              Cristopher Peru, PA-C 06/26/22 0107    Wilkie Aye Mayer Masker, MD 06/27/22 [phone]

## 2024-04-21 ENCOUNTER — Ambulatory Visit: Admitting: Nurse Practitioner

## 2025-06-29 ENCOUNTER — Ambulatory Visit: Admitting: Physician Assistant
